# Patient Record
Sex: Female | Born: 1985 | Race: White | Hispanic: No | Marital: Single | Smoking: Never smoker
Health system: Southern US, Community
[De-identification: ages and names within clinical notes are randomized; demographics above are authoritative.]

## PROBLEM LIST (undated history)

## (undated) DIAGNOSIS — F419 Anxiety disorder, unspecified: Secondary | ICD-10-CM

## (undated) DIAGNOSIS — J45909 Unspecified asthma, uncomplicated: Secondary | ICD-10-CM

## (undated) DIAGNOSIS — N390 Urinary tract infection, site not specified: Secondary | ICD-10-CM

## (undated) DIAGNOSIS — K589 Irritable bowel syndrome without diarrhea: Secondary | ICD-10-CM

## (undated) DIAGNOSIS — F32A Depression, unspecified: Secondary | ICD-10-CM

## (undated) DIAGNOSIS — F329 Major depressive disorder, single episode, unspecified: Secondary | ICD-10-CM

## (undated) HISTORY — DX: Anxiety disorder, unspecified: F41.9

## (undated) HISTORY — DX: Major depressive disorder, single episode, unspecified: F32.9

## (undated) HISTORY — DX: Depression, unspecified: F32.A

## (undated) HISTORY — DX: Unspecified asthma, uncomplicated: J45.909

## (undated) HISTORY — DX: Irritable bowel syndrome, unspecified: K58.9

## (undated) HISTORY — DX: Urinary tract infection, site not specified: N39.0

## (undated) HISTORY — PX: TYMPANOSTOMY TUBE PLACEMENT: SHX32

---
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
## 2007-05-07 ENCOUNTER — Emergency Department (HOSPITAL_COMMUNITY): Admission: EM | Admit: 2007-05-07 | Discharge: 2007-05-07 | Payer: Self-pay | Admitting: Emergency Medicine

## 2013-06-23 ENCOUNTER — Ambulatory Visit (INDEPENDENT_AMBULATORY_CARE_PROVIDER_SITE_OTHER): Payer: BC Managed Care – PPO | Admitting: Family Medicine

## 2013-06-23 ENCOUNTER — Encounter: Payer: Self-pay | Admitting: Family Medicine

## 2013-06-23 VITALS — BP 110/70 | Temp 98.3°F | Ht 69.5 in | Wt 126.0 lb

## 2013-06-23 DIAGNOSIS — Z7189 Other specified counseling: Secondary | ICD-10-CM

## 2013-06-23 DIAGNOSIS — Z7689 Persons encountering health services in other specified circumstances: Secondary | ICD-10-CM

## 2013-06-23 DIAGNOSIS — R300 Dysuria: Secondary | ICD-10-CM

## 2013-06-23 DIAGNOSIS — Z23 Encounter for immunization: Secondary | ICD-10-CM

## 2013-06-23 LAB — POCT URINALYSIS DIPSTICK
Glucose, UA: NEGATIVE
Ketones, UA: NEGATIVE
Protein, UA: NEGATIVE
Urobilinogen, UA: 0.2

## 2013-06-23 MED ORDER — CIPROFLOXACIN HCL 500 MG PO TABS: 500.0000 mg | ORAL_TABLET | Freq: Two times a day (BID) | ORAL | Status: DC

## 2013-06-23 NOTE — Progress Notes (Signed)
Chief Complaint  Patient presents with  . Establish Care    HPI:  [place] is here to establish care. Recently moved from [place]. Last PCP and physical: last physical with pap last summer and normal  Has the following chronic problems and concerns today:  1) Dysuria: -started yesterday -symptoms: burning with urination, pinker urine, frequency -denies: fevers, NVD, flank pain -hx UTI in the last year -FDLMP: last week  There are no active problems to display for this patient.  Health Maintenance: -has had diabetes and lipid screening per her report in last year -pap in 2013 -tdap in 2009 per her report -refused flu vaccine -refused hpv  ROS: See pertinent positives and negatives per HPI.  Past Medical History  Diagnosis Date  . UTI (lower urinary tract infection)   . Depression     on antidepressants in the past, never SI or hospitalization    Family History  Problem Relation Age of Onset  . Adopted: Yes  . Kidney disease Maternal Aunt     History   Social History  . Marital Status: Single    Spouse Name: N/A    Number of Children: N/A  . Years of Education: N/A   Social History Main Topics  . Smoking status: Never Smoker   . Smokeless tobacco: None  . Alcohol Use: None     Comment: rare alchol; 2 drinks a few times per week  . Drug Use: None  . Sexual Activity: Yes    Birth Control/ Protection: Condom     Comment: one partner   Other Topics Concern  . None   Social History Narrative   Work or School: works at [employer] Situation: lives with roommate      Spiritual Beliefs: Christian      Lifestyle: regular CV exercise 3-4 times per week; good diet             Current outpatient prescriptions:ciprofloxacin (CIPRO) 500 MG tablet, Take 1 tablet (500 mg total) by mouth 2 (two) times daily., Disp: 6 tablet, Rfl: 0  EXAM:  Filed Vitals:   06/23/13 1425  BP: 110/70  Temp: 98.3 F (36.8 C)    Body mass  index is 18.35 kg/(m^2).  GENERAL: vitals reviewed and listed above, alert, oriented, appears well hydrated and in no acute distress  HEENT: atraumatic, conjunttiva clear, no obvious abnormalities on inspection of external nose and ears  NECK: no obvious masses on inspection  LUNGS: clear to auscultation bilaterally, no wheezes, rales or rhonchi, good air movement  CV: HRRR, no peripheral edema  ABD: soft, NTTP, no CVA TTP  MS: moves all extremities without noticeable abnormality  PSYCH: pleasant and cooperative, no obvious depression or anxiety  ASSESSMENT AND PLAN:  Discussed the following assessment and plan:  Need for prophylactic vaccination and inoculation against influenza  Dysuria -urine dip with pyuria and a little blood - discussed options and she wants to do empiric treatment, risks discussed -culture pending -just finished period so symptoms and blood on udip could be from that -return precautions  Encounter to establish care  -We reviewed the PMH, PSH, FH, SH, Meds and Allergies. -We provided refills for any medications we will prescribe as needed. -We addressed current concerns per orders and patient instructions. -We have asked for records for pertinent exams, studies, vaccines and notes from previous providers. -We have advised patient to follow up per instructions below.   -Patient advised to return or notify  a doctor immediately if symptoms worsen or persist or new concerns arise.  Patient Instructions  -We have ordered labs or studies at this visit. It can take up to 1-2 weeks for results and processing. We will contact you with instructions IF your results are abnormal. Normal results will be released to your [facility]. If you have not heard from [place] or can not find your results in [facility] in 2 weeks please contact our office.  -PLEASE SIGN UP FOR MYCHART TODAY   We recommend the following healthy lifestyle measures: - eat a healthy diet consisting of  lots of vegetables, fruits, beans, nuts, seeds, healthy meats such as white chicken and fish and whole grains.  - avoid fried foods, fast food, processed foods, sodas, red meet and other fattening foods.  - get a least 150 minutes of aerobic exercise per week.   Follow up in: 1 year or as needed      [place], [place].

## 2013-06-23 NOTE — Addendum Note (Signed)
Addended by: Kern Reap B on: 06/23/2013 03:04 PM   Modules accepted: Orders

## 2013-06-23 NOTE — Patient Instructions (Signed)
-  We have ordered labs or studies at this visit. It can take up to 1-2 weeks for results and processing. We will contact you with instructions IF your results are abnormal. Normal results will be released to your MYCHART. If you have not heard from us or can not find your results in MYCHART in 2 weeks please contact our office.  -PLEASE SIGN UP FOR MYCHART TODAY   We recommend the following healthy lifestyle measures: - eat a healthy diet consisting of lots of vegetables, fruits, beans, nuts, seeds, healthy meats such as white chicken and fish and whole grains.  - avoid fried foods, fast food, processed foods, sodas, red meet and other fattening foods.  - get a least 150 minutes of aerobic exercise per week.   Follow up in: 1 year or as needed  

## 2013-06-25 LAB — URINE CULTURE: Colony Count: >=100000

## 2013-06-30 NOTE — Progress Notes (Signed)
Quick Note:  Called and spoke with pt and pt is aware. ______ 

## 2013-07-14 ENCOUNTER — Ambulatory Visit (INDEPENDENT_AMBULATORY_CARE_PROVIDER_SITE_OTHER): Payer: BC Managed Care – PPO | Admitting: Family Medicine

## 2013-07-14 ENCOUNTER — Encounter: Payer: Self-pay | Admitting: Family Medicine

## 2013-07-14 VITALS — BP 98/68 | Temp 98.4°F | Wt 126.0 lb

## 2013-07-14 DIAGNOSIS — N943 Premenstrual tension syndrome: Secondary | ICD-10-CM

## 2013-07-14 DIAGNOSIS — Z0000 Encounter for general adult medical examination without abnormal findings: Secondary | ICD-10-CM

## 2013-07-14 DIAGNOSIS — L709 Acne, unspecified: Secondary | ICD-10-CM

## 2013-07-14 DIAGNOSIS — Z113 Encounter for screening for infections with a predominantly sexual mode of transmission: Secondary | ICD-10-CM

## 2013-07-14 DIAGNOSIS — L708 Other acne: Secondary | ICD-10-CM

## 2013-07-14 MED ORDER — NORGESTIM-ETH ESTRAD TRIPHASIC 0.18/0.215/0.25 MG-35 MCG PO TABS: 1.0000 | ORAL_TABLET | Freq: Every day | ORAL | Status: DC

## 2013-07-14 NOTE — Patient Instructions (Signed)
-  folic acid daily; Vit D 3 1000 IU daily, calcium 1200mg  total from food and supplement  -We have ordered labs or studies at this visit. It can take up to 1-2 weeks for results and processing. We will contact you with instructions IF your results are abnormal. Normal results will be released to your [facility]. If you have not heard from [place] or can not find your results in [facility] in 2 weeks please contact our office.  -As we discussed, we have prescribed a new medication (birth control pill) for you at this appointment. We discussed the common and serious potential adverse effects of this medication and you can review these and more with the pharmacist when you pick up your medication.  Please follow the instructions for use carefully and notify us immediately if you have any problems taking this medication.  -for acne: Benzoyl peroxide wash daily Cetaphil acne line of washes and lotion if needed  -follow up in 2 months

## 2013-07-14 NOTE — Progress Notes (Addendum)
Chief Complaint  Patient presents with  . Annual Exam    HPI:  Here for CPE:  -Concerns today: none  -Diet: variety of foods, balance and well rounded  -Taking folic acid, vit D, calcium: no  -Exercise: CV exercise 3-4 ties per week  -Diabetes and Dyslipidemia Screening: done last year per her report  -Hx of HTN: no  -Vaccines: refused flu and hpv last visit  -pap history: thinks had this last year and normal   -FDLMP: 1 week ago, has some PMS about 1 week before periods and some ance on chin for this - she wants to start OCPs to help her ance and PMS  -sexual activity: yes, female partner, no new partners  -wants STI testing: wants HIV testing, RPR, Hep B screening  -FH breast, colon or ovarian ca: see FH  -Alcohol, Tobacco, drug use: see social history  Review of Systems - negative except where scheduled  Past Medical History  Diagnosis Date  . UTI (lower urinary tract infection)   . Depression     on antidepressants in the past, never SI or hospitalization  . UTI (lower urinary tract infection)     Past Surgical History  Procedure Laterality Date  . Tympanostomy tube placement    . Eye surgery      Family History  Problem Relation Age of Onset  . Adopted: Yes  . Kidney disease Maternal Aunt     History   Social History  . Marital Status: Single    Spouse Name: N/A    Number of Children: N/A  . Years of Education: N/A   Social History Main Topics  . Smoking status: Never Smoker   . Smokeless tobacco: None  . Alcohol Use: None     Comment: rare alchol; 2 drinks a few times per week  . Drug Use: None  . Sexual Activity: Yes    Birth Control/ Protection: Condom     Comment: one partner   Other Topics Concern  . None   Social History Narrative   Work or School: works at [employer] Situation: lives with roommate      Spiritual Beliefs: Christian      Lifestyle: regular CV exercise 3-4 times per week; good diet             Current outpatient prescriptions:Norgestimate-Ethinyl Estradiol Triphasic (ORTHO TRI-CYCLEN, 28,) 0.18/0.215/0.25 MG-35 MCG tablet, Take 1 tablet by mouth daily., Disp: 1 Package, Rfl: 11  EXAM:  Filed Vitals:   07/14/13 0824  BP: 98/68  Temp: 98.4 F (36.9 C)    GENERAL: vitals reviewed and listed below, alert, oriented, appears well hydrated and in no acute distress  HEENT: head atraumatic,normal appearance of eyes, ears, nose and mouth. moist mucus membranes.  NECK: supple, no masses or lymphadenopathy  LUNGS: clear to auscultation bilaterally, no rales, rhonchi or wheeze  CV: HRRR, no peripheral edema or cyanosis, normal pedal pulses  BREAST: deferred  GU: refused  RECTAL: refused  SKIN: no rash or abnormal lesions  MS: normal gait, moves all extremities normally  NEURO:  normal muscle strength and sensation to light touch on extremities  PSYCH: normal affect, pleasant and cooperative  ASSESSMENT AND PLAN:  Discussed the following assessment and plan:  Visit for preventive health examination  Acne - Plan: Hepatitis B Core AB, Total, POCT urine pregnancy, Norgestimate-Ethinyl Estradiol Triphasic (ORTHO TRI-CYCLEN, 28,) 0.18/0.215/0.25 MG-35 MCG tablet  PMS (premenstrual syndrome) - Plan: Hepatitis B Core AB,  Total, POCT urine pregnancy, Norgestimate-Ethinyl Estradiol Triphasic (ORTHO TRI-CYCLEN, 28,) 0.18/0.215/0.25 MG-35 MCG tablet  Venereal disease screening - Plan: HIV Antibody, RPR, Hep B Surface Antigen, Hepatitis B Core AB, Total   -Discussed and advised all [place] preventive services health task force level A and B recommendations for age, sex and risks.  -refused pap and GC/Chlam/Trich testing  -discussed acne and PMS and advised per orders and instructions - risks discussed  -Advised at least 150 minutes of exercise per week and a healthy diet low in saturated fats and sweets and consisting of fresh fruits and vegetables, lean meats such as fish  and white chicken and whole grains.  -labs, studies and vaccines per orders this encounter  Orders Placed This Encounter  Procedures  . HIV Antibody  . RPR  . Hep B Surface Antigen  . Hepatitis B Core AB, Total  . POCT urine pregnancy    Patient Instructions  -folic acid daily; Vit D 3 1000 IU daily, calcium 1200mg  total from food and supplement  -We have ordered labs or studies at this visit. It can take up to 1-2 weeks for results and processing. We will contact you with instructions IF your results are abnormal. Normal results will be released to your [facility]. If you have not heard from [place] or can not find your results in [facility] in 2 weeks please contact our office.  -As we discussed, we have prescribed a new medication (birth control pill) for you at this appointment. We discussed the common and serious potential adverse effects of this medication and you can review these and more with the pharmacist when you pick up your medication.  Please follow the instructions for use carefully and notify us immediately if you have any problems taking this medication.  -for acne: Benzoyl peroxide wash daily Cetaphil acne line of washes and lotion if needed  -follow up in 2 months       Patient advised to return to clinic immediately if symptoms worsen or persist or new concerns.    No Follow-up on file.  Kriste Basque R.

## 2013-07-15 LAB — RPR

## 2013-07-15 LAB — HIV ANTIBODY (ROUTINE TESTING W REFLEX): HIV: NONREACTIVE

## 2013-07-15 NOTE — Progress Notes (Signed)
Quick Note:  Left a message for pt that labs are normal. ______

## 2014-03-28 ENCOUNTER — Ambulatory Visit (INDEPENDENT_AMBULATORY_CARE_PROVIDER_SITE_OTHER): Payer: BC Managed Care – PPO | Admitting: Family Medicine

## 2014-03-28 ENCOUNTER — Encounter: Payer: Self-pay | Admitting: Family Medicine

## 2014-03-28 ENCOUNTER — Telehealth: Payer: Self-pay | Admitting: Family Medicine

## 2014-03-28 VITALS — BP 100/64 | HR 67 | Temp 98.9°F | Ht 69.5 in | Wt 129.0 lb

## 2014-03-28 DIAGNOSIS — R350 Frequency of micturition: Secondary | ICD-10-CM

## 2014-03-28 LAB — POCT URINALYSIS DIPSTICK
BILIRUBIN UA: NEGATIVE
Ketones, UA: NEGATIVE
Nitrite, UA: POSITIVE
Protein, UA: NEGATIVE
UROBILINOGEN UA: 1
pH, UA: 5.5

## 2014-03-28 MED ORDER — CIPROFLOXACIN HCL 500 MG PO TABS: 500.0000 mg | ORAL_TABLET | Freq: Two times a day (BID) | ORAL | Status: DC

## 2014-03-28 MED ORDER — CIPROFLOXACIN HCL 250 MG PO TABS: 250.0000 mg | ORAL_TABLET | Freq: Two times a day (BID) | ORAL | Status: DC

## 2014-03-28 NOTE — Progress Notes (Signed)
No chief complaint on file.   HPI:  Acute visit for:  1)Dysuria: -started this morning -urinary urgency and frequency, dysuria -denies: fevers, vomiting, abd or flank pain, a little back pain, blood in urine, vaginal symptoms -FDLMP: 1 week ago  ROS: See pertinent positives and negatives per HPI.  Past Medical History  Diagnosis Date  . UTI (lower urinary tract infection)   . Depression     on antidepressants in the past, never SI or hospitalization  . UTI (lower urinary tract infection)     Past Surgical History  Procedure Laterality Date  . Tympanostomy tube placement    . Eye surgery      Family History  Problem Relation Age of Onset  . Adopted: Yes  . Kidney disease Maternal Aunt     History   Social History  . Marital Status: Single    Spouse Name: N/A    Number of Children: N/A  . Years of Education: N/A   Social History Main Topics  . Smoking status: Never Smoker   . Smokeless tobacco: None  . Alcohol Use: None     Comment: rare alchol; 2 drinks a few times per week  . Drug Use: None  . Sexual Activity: Yes    Birth Control/ Protection: Condom     Comment: one partner   Other Topics Concern  . None   Social History Narrative   Work or School: works at [employer]      Home Situation: lives with roommate      Spiritual Beliefs: Christian      Lifestyle: regular CV exercise 3-4 times per week; good diet             Current outpatient prescriptions:Norgestimate-Ethinyl Estradiol Triphasic (ORTHO TRI-CYCLEN, 28,) 0.18/0.215/0.25 MG-35 MCG tablet, Take 1 tablet by mouth daily., Disp: 1 Package, Rfl: 11;  Phenazopyridine HCl (AZO TABS PO), Take by mouth., Disp: , Rfl: ;  ciprofloxacin (CIPRO) 500 MG tablet, Take 1 tablet (500 mg total) by mouth 2 (two) times daily., Disp: 6 tablet, Rfl: 0  EXAM:  Filed Vitals:   03/28/14 1123  BP: 100/64  Pulse: 67  Temp: 98.9 F (37.2 C)    Body mass index is 18.78 kg/(m^2).  GENERAL: vitals  reviewed and listed above, alert, oriented, appears well hydrated and in no acute distress  HEENT: atraumatic, conjunttiva clear, no obvious abnormalities on inspection of external nose and ears  NECK: no obvious masses on inspection  LUNGS: clear to auscultation bilaterally, no wheezes, rales or rhonchi, good air movement  CV: HRRR, no peripheral edema  ABD: soft, NTTP, no CVA TTP  MS: moves all extremities without noticeable abnormality  PSYCH: pleasant and cooperative, no obvious depression or anxiety  ASSESSMENT AND PLAN:  Discussed the following assessment and plan:  Urinary frequency - Plan: POC Urinalysis Dipstick, CULTURE, URINE COMPREHENSIVE, ciprofloxacin (CIPRO) 500 MG tablet, DISCONTINUED: ciprofloxacin (CIPRO) 250 MG tablet  -urine dip c/w  UTI - start cipro after discussion risks/benefits, culture pending -Patient advised to return or notify a doctor immediately if symptoms worsen or persist or new concerns arise.  Patient Instructions  Urinary Tract Infection Urinary tract infections (UTIs) can develop anywhere along your urinary tract. Your urinary tract is your body's drainage system for removing wastes and extra water. Your urinary tract includes two kidneys, two ureters, a bladder, and a urethra. Your kidneys are a pair of bean-shaped organs. Each kidney is about the size of your fist. They are located below  your ribs, one on each side of your spine. CAUSES Infections are caused by microbes, which are microscopic organisms, including fungi, viruses, and bacteria. These organisms are so small that they can only be seen through a microscope. Bacteria are the microbes that most commonly cause UTIs. SYMPTOMS  Symptoms of UTIs may vary by age and gender of the patient and by the location of the infection. Symptoms in young women typically include a frequent and intense urge to urinate and a painful, burning feeling in the bladder or urethra during urination. Older women  and men are more likely to be tired, shaky, and weak and have muscle aches and abdominal pain. A fever may mean the infection is in your kidneys. Other symptoms of a kidney infection include pain in your back or sides below the ribs, nausea, and vomiting. DIAGNOSIS To diagnose a UTI, your caregiver will ask you about your symptoms. Your caregiver also will ask to provide a urine sample. The urine sample will be tested for bacteria and white blood cells. White blood cells are made by your body to help fight infection. TREATMENT  Typically, UTIs can be treated with medication. Because most UTIs are caused by a bacterial infection, they usually can be treated with the use of antibiotics. The choice of antibiotic and length of treatment depend on your symptoms and the type of bacteria causing your infection. HOME CARE INSTRUCTIONS  If you were prescribed antibiotics, take them exactly as your caregiver instructs you. Finish the medication even if you feel better after you have only taken some of the medication.  Drink enough water and fluids to keep your urine clear or pale yellow.  Avoid caffeine, tea, and carbonated beverages. They tend to irritate your bladder.  Empty your bladder often. Avoid holding urine for long periods of time.  Empty your bladder before and after sexual intercourse.  After a bowel movement, women should cleanse from front to back. Use each tissue only once. SEEK MEDICAL CARE IF:   You have back pain.  You develop a fever.  Your symptoms do not begin to resolve within 3 days. SEEK IMMEDIATE MEDICAL CARE IF:   You have severe back pain or lower abdominal pain.  You develop chills.  You have nausea or vomiting.  You have continued burning or discomfort with urination. MAKE SURE YOU:   Understand these instructions.  Will watch your condition.  Will get help right away if you are not doing well or get worse. Document Released: 07/23/2005 Document Revised:  04/13/2012 Document Reviewed: 11/21/2011 [facility] Patient Information 2014 [place], [place].      Terressa Koyanagi

## 2014-03-28 NOTE — Telephone Encounter (Signed)
Cindy from [facility] calling to confirm correct rx for ciprofloxacin (CIPRO).  She states they received two scripts one for 250mg  and one for 500mg .  Which one should they fill.  Please call back.

## 2014-03-28 NOTE — Patient Instructions (Signed)
Urinary Tract Infection  Urinary tract infections (UTIs) can develop anywhere along your urinary tract. Your urinary tract is your body's drainage system for removing wastes and extra water. Your urinary tract includes two kidneys, two ureters, a bladder, and a urethra. Your kidneys are a pair of bean-shaped organs. Each kidney is about the size of your fist. They are located below your ribs, one on each side of your spine.  CAUSES  Infections are caused by microbes, which are microscopic organisms, including fungi, viruses, and bacteria. These organisms are so small that they can only be seen through a microscope. Bacteria are the microbes that most commonly cause UTIs.  SYMPTOMS   Symptoms of UTIs may vary by age and gender of the patient and by the location of the infection. Symptoms in young women typically include a frequent and intense urge to urinate and a painful, burning feeling in the bladder or urethra during urination. Older women and men are more likely to be tired, shaky, and weak and have muscle aches and abdominal pain. A fever may mean the infection is in your kidneys. Other symptoms of a kidney infection include pain in your back or sides below the ribs, nausea, and vomiting.  DIAGNOSIS  To diagnose a UTI, your caregiver will ask you about your symptoms. Your caregiver also will ask to provide a urine sample. The urine sample will be tested for bacteria and white blood cells. White blood cells are made by your body to help fight infection.  TREATMENT   Typically, UTIs can be treated with medication. Because most UTIs are caused by a bacterial infection, they usually can be treated with the use of antibiotics. The choice of antibiotic and length of treatment depend on your symptoms and the type of bacteria causing your infection.  HOME CARE INSTRUCTIONS   If you were prescribed antibiotics, take them exactly as your caregiver instructs you. Finish the medication even if you feel better after you  have only taken some of the medication.   Drink enough water and fluids to keep your urine clear or pale yellow.   Avoid caffeine, tea, and carbonated beverages. They tend to irritate your bladder.   Empty your bladder often. Avoid holding urine for long periods of time.   Empty your bladder before and after sexual intercourse.   After a bowel movement, women should cleanse from front to back. Use each tissue only once.  SEEK MEDICAL CARE IF:    You have back pain.   You develop a fever.   Your symptoms do not begin to resolve within 3 days.  SEEK IMMEDIATE MEDICAL CARE IF:    You have severe back pain or lower abdominal pain.   You develop chills.   You have nausea or vomiting.   You have continued burning or discomfort with urination.  MAKE SURE YOU:    Understand these instructions.   Will watch your condition.   Will get help right away if you are not doing well or get worse.  Document Released: 07/23/2005 Document Revised: 04/13/2012 Document Reviewed: 11/21/2011  [employer] Patient Information 2014 [employer].

## 2014-03-28 NOTE — Progress Notes (Signed)
Pre visit review using our clinic review tool, if applicable. No additional management support is needed unless otherwise documented below in the visit note. 

## 2014-03-28 NOTE — Telephone Encounter (Signed)
Per Dr Selena Batten the pt should have Cipro 500mg  and I called the pharmacy and Arline Asp was informed of this.

## 2014-03-31 LAB — CULTURE, URINE COMPREHENSIVE

## 2014-06-14 ENCOUNTER — Encounter: Payer: Self-pay | Admitting: Family Medicine

## 2014-06-14 ENCOUNTER — Ambulatory Visit (INDEPENDENT_AMBULATORY_CARE_PROVIDER_SITE_OTHER): Payer: BC Managed Care – PPO | Admitting: Family Medicine

## 2014-06-14 VITALS — BP 98/64 | HR 72 | Temp 99.4°F | Ht 69.5 in | Wt 124.5 lb

## 2014-06-14 DIAGNOSIS — Z3009 Encounter for other general counseling and advice on contraception: Secondary | ICD-10-CM

## 2014-06-14 DIAGNOSIS — F32A Depression, unspecified: Secondary | ICD-10-CM

## 2014-06-14 DIAGNOSIS — F3289 Other specified depressive episodes: Secondary | ICD-10-CM

## 2014-06-14 DIAGNOSIS — F329 Major depressive disorder, single episode, unspecified: Secondary | ICD-10-CM

## 2014-06-14 MED ORDER — FLUVOXAMINE MALEATE 50 MG PO TABS: 50.0000 mg | ORAL_TABLET | Freq: Every day | ORAL | Status: DC

## 2014-06-14 NOTE — Progress Notes (Signed)
Pre visit review using our clinic review tool, if applicable. No additional management support is needed unless otherwise documented below in the visit note. 

## 2014-06-14 NOTE — Patient Instructions (Signed)
-  As we discussed, we have prescribed a new medication (luvox) for you at this appointment. We discussed the common and serious potential adverse effects of this medication and you can review these and more with the pharmacist when you pick up your medication.  Please follow the instructions for use carefully and notify us immediately if you have any problems taking this medication. -do not stop this medication suddenly - taper  -set up counseling  -follow up in 2-4 weeks

## 2014-06-14 NOTE — Progress Notes (Signed)
No chief complaint on file.   HPI:  1)Contraceptive Management: -on orthotricyclin  -stable   2)Depression: -reports intermittent depression since college -symptoms: sadness, irritability, depressed mood, worse prior to periods, lack of energy -she is exercising and eating well -hx of pain in multiple sites but reports negative workup with rheumatologist -took fluvoxamine in the past and this worked well -lost mother and father during highschool   ROS: See pertinent positives and negatives per HPI.  Past Medical History  Diagnosis Date  . UTI (lower urinary tract infection)   . Depression     on antidepressants in the past, never SI or hospitalization  . UTI (lower urinary tract infection)     Past Surgical History  Procedure Laterality Date  . Tympanostomy tube placement    . Eye surgery      Family History  Problem Relation Age of Onset  . Adopted: Yes  . Kidney disease Maternal Aunt     History   Social History  . Marital Status: Single    Spouse Name: N/A    Number of Children: N/A  . Years of Education: N/A   Social History Main Topics  . Smoking status: Never Smoker   . Smokeless tobacco: None  . Alcohol Use: None     Comment: rare alchol; 2 drinks a few times per week  . Drug Use: None  . Sexual Activity: Yes    Birth Control/ Protection: Condom     Comment: one partner   Other Topics Concern  . None   Social History Narrative   Work or School: works at [employer] Situation: lives with roommate      Spiritual Beliefs: Christian      Lifestyle: regular CV exercise 3-4 times per week; good diet             Current outpatient prescriptions:Norgestimate-Ethinyl Estradiol Triphasic (ORTHO TRI-CYCLEN, 28,) 0.18/0.215/0.25 MG-35 MCG tablet, Take 1 tablet by mouth daily., Disp: 1 Package, Rfl: 11;  fluvoxaMINE (LUVOX) 50 MG tablet, Take 1 tablet (50 mg total) by mouth at bedtime., Disp: 30 tablet, Rfl: 1  EXAM:  Filed  Vitals:   06/14/14 1021  BP: 98/64  Pulse: 72  Temp: 99.4 F (37.4 C)    Body mass index is 18.13 kg/(m^2).  GENERAL: vitals reviewed and listed above, alert, oriented, appears well hydrated and in no acute distress  HEENT: atraumatic, conjunttiva clear, no obvious abnormalities on inspection of external nose and ears  NECK: no obvious masses on inspection  LUNGS: clear to auscultation bilaterally, no wheezes, rales or rhonchi, good air movement  CV: HRRR, no peripheral edema  MS: moves all extremities without noticeable abnormality  PSYCH: pleasant and cooperative, no obvious depression or anxiety  ASSESSMENT AND PLAN:  Discussed the following assessment and plan:  Depression - Plan: fluvoxaMINE (LUVOX) 50 MG tablet  Encounter for other general counseling or advice on contraception  -discussed options and risks at lengths - she opted for CBT and will schedule and starting luvox - follow in 2- 4 weeks -discussed doing some basic lab work - but she has had labs before and ok per her report -Patient advised to return or notify a doctor immediately if symptoms worsen or persist or new concerns arise.  Patient Instructions  -As we discussed, we have prescribed a new medication (luvox) for you at this appointment. We discussed the common and serious potential adverse effects of this medication and you can review these  and more with the pharmacist when you pick up your medication.  Please follow the instructions for use carefully and notify us immediately if you have any problems taking this medication. -do not stop this medication suddenly - taper  -set up counseling  -follow up in 2-4 weeks       KIM, HANNAH R.

## 2014-07-06 ENCOUNTER — Ambulatory Visit (INDEPENDENT_AMBULATORY_CARE_PROVIDER_SITE_OTHER): Payer: BC Managed Care – PPO | Admitting: Family Medicine

## 2014-07-06 ENCOUNTER — Encounter: Payer: Self-pay | Admitting: Family Medicine

## 2014-07-06 VITALS — BP 100/68 | HR 82 | Temp 98.6°F | Ht 69.5 in | Wt 126.5 lb

## 2014-07-06 DIAGNOSIS — N946 Dysmenorrhea, unspecified: Secondary | ICD-10-CM

## 2014-07-06 DIAGNOSIS — R5383 Other fatigue: Secondary | ICD-10-CM

## 2014-07-06 DIAGNOSIS — F3289 Other specified depressive episodes: Secondary | ICD-10-CM

## 2014-07-06 DIAGNOSIS — F32A Depression, unspecified: Secondary | ICD-10-CM

## 2014-07-06 DIAGNOSIS — L680 Hirsutism: Secondary | ICD-10-CM

## 2014-07-06 DIAGNOSIS — F329 Major depressive disorder, single episode, unspecified: Secondary | ICD-10-CM

## 2014-07-06 DIAGNOSIS — R5381 Other malaise: Secondary | ICD-10-CM

## 2014-07-06 MED ORDER — FLUVOXAMINE MALEATE 50 MG PO TABS: 100.0000 mg | ORAL_TABLET | Freq: Every day | ORAL | Status: DC

## 2014-07-06 NOTE — Patient Instructions (Signed)
-  increase fluvoxamine to 50 mg in the morning and  in the evening for 1 week  -then increase to  in the morning and 50 mg in the evening  -then  twice daily

## 2014-07-06 NOTE — Progress Notes (Signed)
No chief complaint on file.   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 Reports: doing a little better, improved after period, tolerating the medication well, has not set up counseling  Depression Symptoms: Sleep disorder: yes Interest deficit/anhedonia: improved a little Guilt (worthlessness, hopelessness, regret): no Energy deficit: still tired in the afternoon Concentration deficit: yes, not improved Appetite disorder: cravings, not improved Psychomotor retardation or agitation: sluggish Suicidality: no  Acne/Dysmenorrhea/Hair on neck: -reports has had these issues for greater then 10 years with hormones and thyroid check in the past and normal and nobody helps her with this -wants to see an endocrinologist ROS: See pertinent positives and negatives per HPI.  Past Medical History  Diagnosis Date  . UTI (lower urinary tract infection)   . Depression     on antidepressants in the past, never SI or hospitalization  . UTI (lower urinary tract infection)     Past Surgical History  Procedure Laterality Date  . Tympanostomy tube placement    . Eye surgery      Family History  Problem Relation Age of Onset  . Adopted: Yes  . Kidney disease Maternal Aunt     History   Social History  . Marital Status: Single    Spouse Name: N/A    Number of Children: N/A  . Years of Education: N/A   Social History Main Topics  . Smoking status: Never Smoker   . Smokeless tobacco: None  . Alcohol Use: None     Comment: rare alchol; 2 drinks a few times per week  . Drug Use: None  . Sexual Activity: Yes    Birth Control/ Protection: Condom     Comment: one partner   Other Topics Concern  . None   Social History Narrative   Work or School: works at [employer] Situation: lives with roommate      Spiritual Beliefs: Christian      Lifestyle: regular CV exercise 3-4 times per week; good diet              Current outpatient prescriptions:fluvoxaMINE (LUVOX) 50 MG tablet, Take 2 tablets (100 mg total) by mouth at bedtime., Disp: 120 tablet, Rfl: 3;  Norgestimate-Ethinyl Estradiol Triphasic (ORTHO TRI-CYCLEN, 28,) 0.18/0.215/0.25 MG-35 MCG tablet, Take 1 tablet by mouth daily., Disp: 1 Package, Rfl: 11  EXAM:  Filed Vitals:   07/06/14 1520  BP: 100/68  Pulse: 82  Temp: 98.6 F (37 C)    Body mass index is 18.42 kg/(m^2).  GENERAL: vitals reviewed and listed above, alert, oriented, appears well hydrated and in no acute distress  HEENT: atraumatic, conjunttiva clear, no obvious abnormalities on inspection of external nose and ears  NECK: no obvious masses on inspection  LUNGS: clear to auscultation bilaterally, no wheezes, rales or rhonchi, good air movement  CV: HRRR, no peripheral edema  MS: moves all extremities without noticeable abnormality  PSYCH: pleasant and cooperative, no obvious depression or anxiety  ASSESSMENT AND PLAN:  Discussed the following assessment and plan:  Depression - Plan: fluvoxaMINE (LUVOX) 50 MG tablet, CANCELED: Hemoglobin A1c, CANCELED: TSH, CANCELED: CBC with Differential, CANCELED: T4, Free, CANCELED: Testosterone, Free, Total  Other malaise and fatigue - Plan: Ambulatory referral to Endocrinology, CANCELED: Hemoglobin A1c, CANCELED: TSH, CANCELED: CBC with Differential, CANCELED: T4, Free, CANCELED: Testosterone, Free, Total  Hirsutism - Plan: Ambulatory referral to Endocrinology  Dysmenorrhea - Plan: Ambulatory referral to  Endocrinology  -discussed PCOS, other diagnosis, dx, treatment of symptoms - and advised some lab work - she really wants to see and endocrinologist for this so referral placed and does not want to get labs today as reports she may pass out and wants to wait to see endocrinologist -for depression: advised counseling and increasing luvox per titration instructions -follow up in 4-6 weeks -Patient advised to  return or notify a doctor immediately if symptoms worsen or persist or new concerns arise.  Patient Instructions  -increase fluvoxamine to 50 mg in the morning and  in the evening for 1 week  -then increase to  in the morning and 50 mg in the evening  -then  twice daily     Amiel Sharrow R.

## 2014-07-06 NOTE — Progress Notes (Signed)
Pre visit review using our clinic review tool, if applicable. No additional management support is needed unless otherwise documented below in the visit note. 

## 2014-07-22 ENCOUNTER — Other Ambulatory Visit: Payer: Self-pay | Admitting: Family Medicine

## 2014-07-25 ENCOUNTER — Other Ambulatory Visit: Payer: Self-pay | Admitting: Internal Medicine

## 2014-07-25 ENCOUNTER — Ambulatory Visit (INDEPENDENT_AMBULATORY_CARE_PROVIDER_SITE_OTHER): Payer: BC Managed Care – PPO | Admitting: Internal Medicine

## 2014-07-25 ENCOUNTER — Encounter: Payer: Self-pay | Admitting: Internal Medicine

## 2014-07-25 VITALS — BP 102/60 | HR 68 | Temp 98.0°F | Resp 12 | Ht 69.0 in | Wt 129.0 lb

## 2014-07-25 DIAGNOSIS — L680 Hirsutism: Secondary | ICD-10-CM

## 2014-07-25 MED ORDER — LEVONORGEST-ETH ESTRAD 91-DAY 0.15-0.03 &0.01 MG PO TABS: 1.0000 | ORAL_TABLET | Freq: Every day | ORAL | Status: DC

## 2014-07-25 NOTE — Progress Notes (Addendum)
Patient ID: Destiny Wade, female   DOB: 1986/10/24, 28 y.o.   MRN: [id number]  HPI: Destiny Razoriffany Lunney is a 28 y.o. female, referred by Dr Selena BattenKim, in consultation for hirsutism (? PCOS).  Hirsutism: - legs > arms > chin > upper lip > chest - started even as a child - shaves daily - allergic to depilatory creams  - tried waxing: eyebrows, (electric tweezer)  Acne: - cystic acne - face > arms > thighs > buttocks - exacerbated recently, after age 89 y/o - not severe as a teenager - not seeing dermatology - she saw them before >> advised to try Accutane (did not try) - used to use benzoyl peroxide  Weight gain: - no - weight has been stable - no steroid use - no weight loss meds - exercises 3-5x a week: running, lifting weights  Fertility/Menstrual cycles: - had monthly menses, not quite every 4 weeks, heavy and painful - no h/o ovarian cysts - children: 0 - miscarriages:0 - contraception: triphasic OCPs  Treatments tried: - did not try Metformin - did not try Spironolactone - did not try Vaniqua - + on OCPs - started Tri-Previfem 1 year ago >> no change in acne and hirsutism - these are worse 1 week before periods >> now bleeds monthly  Other meds: - SSRIs: no, but on Luvox  - Last thyroid tests - 1 year ago: TSH normal  She was adopted. No FH of infertility, though. FH of DM in mother. She has a h/o fainting if she does not eat in time - both in mother and sister. Saw rheumatology and genetics >> has hyperlaxity of joints. Has canker sores in mouth.   ROS: Constitutional: no weight gain, + fatigue, no subjective hyperthermia/hypothermia, + poor sleep Eyes: no blurry vision, no xerophthalmia ENT: no sore throat, no nodules palpated in throat, no dysphagia/odynophagia, no hoarseness Cardiovascular: no CP/SOB/palpitations/leg swelling Respiratory: no cough/SOB Gastrointestinal: + N/no V/+ D/+ C Musculoskeletal: no muscle/+ joint aches Skin: + acne, + hair on  face Neurological: no tremors/numbness/tingling/dizziness, + easy bruising, + hair loss Psychiatric: + depression/+ anxiety  Past Medical History  Diagnosis Date  . UTI (lower urinary tract infection)   . Depression     on antidepressants in the past, never SI or hospitalization  . UTI (lower urinary tract infection)    Past Surgical History  Procedure Laterality Date  . Tympanostomy tube placement    . Eye surgery     History   Social History  . Marital Status: Single    Spouse Name: N/A    Number of Children: 0   Occupational History  . teacher   Social History Main Topics  . Smoking status: Never Smoker   . Smokeless tobacco: No  . Alcohol Use: No     Comment: beer, once a week  . Drug Use: No  . Sexual Activity: Yes    Birth Control/ Protection: Condom     Comment: one partner   Social History Narrative   Work or School: works at [employer]      Home Situation: lives with roommate      Spiritual Beliefs: Christian      Lifestyle: regular CV exercise 3-4 times per week; good diet   Current Outpatient Prescriptions on File Prior to Visit  Medication Sig Dispense Refill  . TRI-PREVIFEM 0.18/0.215/0.25 MG-35 MCG tablet TAKE 1 TABLET BY MOUTH DAILY  28 tablet  0   No current facility-administered medications on file prior to visit.  Allergies  Allergen Reactions  . Ceclor [Cefaclor]   . Penicillins     Unsure of reaction because was a baby  . Sulfa Antibiotics    Family History  Problem Relation Age of Onset  . Adopted: Yes  . Kidney disease Maternal Aunt    PE: BP 102/60  Pulse 68  Temp(Src) 98 F (36.7 C) (Oral)  Resp 12  Ht 5\' 9"  (1.753 m)  Wt 129 lb (58.514 kg)  BMI 19.04 kg/m2  SpO2 99% Wt Readings from Last 3 Encounters:  07/25/14 129 lb (58.514 kg)  07/06/14 126 lb 8 oz (57.38 kg)  06/14/14 124 lb 8 oz (56.473 kg)   Constitutional: thin, in NAD, no full supraclavicular fat pads Eyes: PERRLA, EOMI, no exophthalmos ENT: moist  mucous membranes, no thyromegaly, no cervical lymphadenopathy Cardiovascular: RRR, No MRG Respiratory: CTA B Gastrointestinal: abdomen soft, NT, ND, BS+ Musculoskeletal: no deformities, strength intact in all 4 Skin: moist, warm; + acne on face, + few dark terminal hairs on chin, + vellum on sideburns, no skin tags, no acanthosis nigricans, no purple, wide, stretch marks Neurological: no tremor with outstretched hands, DTR normal in all 4  ASSESSMENT: 1. Hirsutism - ?PCOS  PLAN: 1.  Patient has several clinical features consistent with PCOS (acne, hirsutism), so I suspect that she has this condition, although we cannot clearly verify by lab work since she is on OCPs.  - will also need to check several tests to rule out other possible endocrine problems: Orders Placed This Encounter  Procedures  . TSH  . T4, free  . T3, free  . 17-Hydroxyprogesterone  . Prolactin  . Testosterone, free, total  . HgB A1c  . Vitamin D (25 hydroxy)  . DHEA-sulfate  . Androstenedione  - I had a long discussion with the patient about the fact that the PCOS is a sum of several conditions, including:  acne  hirsutism  irregular menstrual cycles  decreased fertility  weight gain  insulin resistance (and therefore a higher risk of developing diabetes later in life) - We also discussed about the fact that the treatment is usually targeted to addressing the problem that concerns the patient the most: acne/hirsutism, weight gain, or fertility, but there is no single treatment for PCOS.  - The first-line therapy are oral contraceptives. If she is concerned with her weight, we can use metformin; if she is concerned about acne/hirsutism, we can add spironolactone; and if she is concerned about fertility, I could refer her to reproductive endocrinology for possible use of clomiphene. - she is not interested in fertility right now, she might consider this in few years - she does not have any issues with  weight gain - she is most interested in improving her acne and hirsutism - Also, she has severe symptoms of PNDD a week before her placebo pills - she is on a triphasic OCP - We discussed about the need for an oral contraceptive to suppress the testosterone levels and improved acne and hirsutism. She did not see any improvement in these after adding the triphasic OCP and also this is not ideal for PMDD. Therefore, I suggested Seasonique which will allow a period of a week withdrawal bleed every 3 months. Even during this week, Seasonique will provide a low dose of estrogen. I believe that this would improve her mood before her withdrawal bleeds, and will also have with a more consistent hormonal pattern, thus helping her hirsutism. - We also discussed about possibly adding spironolactone  in 6 months after adding Seasonique, if there is no satisfactory improvement in her acne and excess hair. I discussed about possible side effect of spironolactone to include lower blood pressure and higher potassium - Return in about 6 months (around 01/23/2015).   Component     Latest Ref Rng 07/25/2014  Testosterone     10 - 70 ng/dL 41  Sex Hormone Binding     18 - 114 nmol/L 71  Testosterone Free     0.6 - 6.8 pg/mL 4.4  Testosterone-% Free     0.4 - 2.4 % 1.1  Hemoglobin A1C     <5.7 % 5.5  Mean Plasma Glucose     <117 mg/dL 409  TSH     8.119 - 1.478 uIU/mL 1.017  Free T4     0.80 - 1.80 ng/dL 2.95  Prolactin      3.6  DHEA-SO4     18 - 391 ug/dL 621  Androstenedione      93  17-OH-Progesterone, LC/MS/MS      13  T3, Free     2.3 - 4.2 pg/mL 2.8  Vit D, 25-Hydroxy     30 - 89 ng/mL 49   Labs not indicating PCOS, but this is likely 2/2 being on OCPs. I also could not check LH/FSH for the same reason. No signs of prolactinoma, [place]-CAH, thyroid ds., DM or Prediabetes. I believe that clinically, she likely has PCOS. Will continue above plan - may add Spironolactone at next visit.

## 2014-07-25 NOTE — Patient Instructions (Addendum)
Please stop the Tri-Previfem and start Seasonique. Please stop at [facility] lab downstairs. Please come back for a follow-up appointment in 6 months. Hang in there!  Polycystic Ovarian Syndrome Polycystic ovarian syndrome (PCOS) is a common hormonal disorder among women of reproductive age. Most women with PCOS grow many small cysts on their ovaries. PCOS can cause problems with your periods and make it difficult to get pregnant. It can also cause an increased risk of miscarriage with pregnancy. If left untreated, PCOS can lead to serious health problems, such as diabetes and heart disease. CAUSES The cause of PCOS is not fully understood, but genetics may be a factor. SIGNS AND SYMPTOMS   Infrequent or no menstrual periods.   Inability to get pregnant (infertility) because of not ovulating.   Increased growth of hair on the face, chest, stomach, back, thumbs, thighs, or toes.   Acne, oily skin, or dandruff.   Pelvic pain.   Weight gain or obesity, usually carrying extra weight around the waist.   Type 2 diabetes.   High cholesterol.   High blood pressure.   Female-pattern baldness or thinning hair.   Patches of thickened and dark brown or black skin on the neck, arms, breasts, or thighs.   Tiny excess flaps of skin (skin tags) in the armpits or neck area.   Excessive snoring and having breathing stop at times while asleep (sleep apnea).   Deepening of the voice.   Gestational diabetes when pregnant.  DIAGNOSIS  There is no single test to diagnose PCOS.   Your health care provider will:   Take a medical history.   Perform a pelvic exam.   Have ultrasonography done.   Check your female and female hormone levels.   Measure glucose or sugar levels in the blood.   Do other blood tests.   If you are producing too many female hormones, your health care provider will make sure it is from PCOS. At the physical exam, your health care provider will want  to evaluate the areas of increased hair growth. Try to allow natural hair growth for a few days before the visit.   During a pelvic exam, the ovaries may be enlarged or swollen because of the increased number of small cysts. This can be seen more easily by using vaginal ultrasonography or screening to examine the ovaries and lining of the uterus (endometrium) for cysts. The uterine lining may become thicker if you have not been having a regular period.  TREATMENT  Because there is no cure for PCOS, it needs to be managed to prevent problems. Treatments are based on your symptoms. Treatment is also based on whether you want to have a baby or whether you need contraception.  Treatment may include:   Progesterone hormone to start a menstrual period.   Birth control pills to make you have regular menstrual periods.   Medicines to make you ovulate, if you want to get pregnant.   Medicines to control your insulin.   Medicine to control your blood pressure.   Medicine and diet to control your high cholesterol and triglycerides in your blood.  Medicine to reduce excessive hair growth.  Surgery, making small holes in the ovary, to decrease the amount of female hormone production. This is done through a long, lighted tube (laparoscope) placed into the pelvis through a tiny incision in the lower abdomen.  HOME CARE INSTRUCTIONS  Only take over-the-counter or prescription medicine as directed by your health care provider.  Pay attention to  the foods you eat and your activity levels. This can help reduce the effects of PCOS.  Keep your weight under control.  Eat foods that are low in carbohydrate and high in fiber.  Exercise regularly. SEEK MEDICAL CARE IF:  Your symptoms do not get better with medicine.  You have new symptoms. Document Released: 02/06/2005 Document Revised: 08/03/2013 Document Reviewed: 03/31/2013 [facility] Patient Information 2015 [employer] [place]. This information  is not intended to replace advice given to you by your health care provider. Make sure you discuss any questions you have with your health care provider.

## 2014-07-26 DIAGNOSIS — L680 Hirsutism: Secondary | ICD-10-CM | POA: Insufficient documentation

## 2014-07-26 LAB — TESTOSTERONE, FREE, TOTAL, SHBG
SEX HORMONE BINDING: 71 nmol/L (ref 18–114)
Testosterone, Free: 4.4 pg/mL (ref 0.6–6.8)
Testosterone-% Free: 1.1 % (ref 0.4–2.4)
Testosterone: 41 ng/dL (ref 10–70)

## 2014-07-26 LAB — PROLACTIN: PROLACTIN: 3.6 ng/mL

## 2014-07-26 LAB — HEMOGLOBIN A1C
HEMOGLOBIN A1C: 5.5 % (ref <5.7)
MEAN PLASMA GLUCOSE: 111 mg/dL (ref <117)

## 2014-07-26 LAB — DHEA-SULFATE: DHEA SO4: 131 ug/dL (ref 18–391)

## 2014-07-26 LAB — TSH: TSH: 1.017 u[IU]/mL (ref 0.350–4.500)

## 2014-07-26 LAB — T3, FREE: T3, Free: 2.8 pg/mL (ref 2.3–4.2)

## 2014-07-26 LAB — T4, FREE: FREE T4: 0.96 ng/dL (ref 0.80–1.80)

## 2014-07-26 LAB — VITAMIN D 25 HYDROXY (VIT D DEFICIENCY, FRACTURES): Vit D, 25-Hydroxy: 49 ng/mL (ref 30–89)

## 2014-07-29 LAB — 17-HYDROXYPROGESTERONE: 17-OH-Progesterone, LC/MS/MS: 13 ng/dL

## 2014-07-30 LAB — ANDROSTENEDIONE: Androstenedione: 93 ng/dL

## 2014-08-08 ENCOUNTER — Telehealth: Payer: Self-pay | Admitting: Family Medicine

## 2014-08-08 DIAGNOSIS — K900 Celiac disease: Secondary | ICD-10-CM

## 2014-08-08 NOTE — Telephone Encounter (Signed)
Please let her know - advise we see her here to eval for her concerns for celiac as usually workup for this starts with labs which we could do as usually takes a while to get in with Dr. Loreta AveMann.

## 2014-08-08 NOTE — Telephone Encounter (Signed)
I called the pt and she stated she feels she may have celiac disease due to history of recurrent symptoms such as recurrent conjunctivitis, acne, etc. Message forwarded to Dr Selena BattenKim.

## 2014-08-08 NOTE — Telephone Encounter (Signed)
I have not had any of my patients be unhappy with Dr. Elvera LennoxGherghe. What question would she have for Dr. Loreta AveMann that was not answered by Dr. Elvera LennoxGherghe - is referral for the posisble PCOS? -  so can place referral?

## 2014-08-08 NOTE — Telephone Encounter (Signed)
[facility]

## 2014-08-08 NOTE — Telephone Encounter (Signed)
Dr Brett AlbinoKim-Per Marchelle FolksAmanda the pt told her she did not like Dr Elvera LennoxGherghe and wanted to see Dr Loreta AveMann instead.

## 2014-08-08 NOTE — Telephone Encounter (Signed)
Pt states she would prefer to have an appt with Dr Loreta AveMann instead and this was approved by Dr Selena BattenKim. Referral placed to Dr Loreta AveMann and the pt is aware someone will call with an appt.

## 2014-08-08 NOTE — Telephone Encounter (Signed)
Can you find out what the referral if for? Thanks.

## 2014-08-10 ENCOUNTER — Ambulatory Visit: Payer: BC Managed Care – PPO | Admitting: Family Medicine

## 2014-08-31 ENCOUNTER — Encounter: Payer: Self-pay | Admitting: Gastroenterology

## 2014-09-07 ENCOUNTER — Other Ambulatory Visit: Payer: Self-pay | Admitting: Gastroenterology

## 2014-09-07 DIAGNOSIS — R1012 Left upper quadrant pain: Secondary | ICD-10-CM

## 2014-09-12 ENCOUNTER — Ambulatory Visit
Admission: RE | Admit: 2014-09-12 | Discharge: 2014-09-12 | Disposition: A | Discharge status: Home / Self Care | Payer: BC Managed Care – PPO | Source: Ambulatory Visit | Attending: Gastroenterology | Admitting: Gastroenterology

## 2014-09-12 DIAGNOSIS — R1012 Left upper quadrant pain: Secondary | ICD-10-CM

## 2014-09-12 MED ORDER — IOHEXOL 300 MG/ML  SOLN
100.0000 mL | Freq: Once | INTRAMUSCULAR | Status: AC | PRN
  Administered 2014-09-12: 100 mL via INTRAVENOUS

## 2014-09-13 ENCOUNTER — Telehealth: Payer: Self-pay | Admitting: Obstetrics & Gynecology

## 2014-09-13 NOTE — Telephone Encounter (Signed)
lmtcb to schedule incoming MD referral with Dr. Hyacinth MeekerMiller or Dr. Edward JollySilva.

## 2014-11-09 ENCOUNTER — Encounter: Payer: Self-pay | Admitting: Obstetrics & Gynecology

## 2014-11-09 ENCOUNTER — Ambulatory Visit (INDEPENDENT_AMBULATORY_CARE_PROVIDER_SITE_OTHER): Payer: BLUE CROSS/BLUE SHIELD | Admitting: Obstetrics & Gynecology

## 2014-11-09 VITALS — BP 108/66 | HR 78 | Resp 18 | Ht 69.0 in | Wt 128.0 lb

## 2014-11-09 DIAGNOSIS — L680 Hirsutism: Secondary | ICD-10-CM

## 2014-11-09 DIAGNOSIS — N938 Other specified abnormal uterine and vaginal bleeding: Secondary | ICD-10-CM

## 2014-11-09 DIAGNOSIS — E282 Polycystic ovarian syndrome: Secondary | ICD-10-CM

## 2014-11-09 DIAGNOSIS — Z0000 Encounter for general adult medical examination without abnormal findings: Secondary | ICD-10-CM

## 2014-11-09 LAB — POCT URINALYSIS DIPSTICK
Urobilinogen, UA: NEGATIVE
pH, UA: 5

## 2014-11-09 NOTE — Progress Notes (Signed)
29 y.o. G0P0000 SingleCaucasianF here for new patient exam and for evaluation for possible PCOS.  Pt has been on oral contraceptives for about three or four years.  Cycles before that time were longer and heavier with increased cramping.  Pt reports never missing a cycle in a month.  Pt saw Dr. Elvera LennoxGherghe in September and was switched to Seasonale.  She just finished the first "pack" of Seasonale.  Reports she things this has really helped with her anxiety and significant mood swings.  Not really sure it has helped with her "excessive hair growth".   Pt has seen Dr. Kriste BasqueHannah Kim and Dr. Elvera LennoxGherghe and Dr. Loreta AveMann.  Notes reviewed in EPIC.  Entire battery of tests, especially hormonal testing, was all in normal ranges.  Pt aware.  Dr. Elvera LennoxGherghe still felt pt may have PCOS so OCPs changed and spironolactone discussed.    Biggest complaints are cystic acne and hirsutism.  Pt has tried waxing and shaving and depilatories in the past.  She is allergic to depilatory creams so this doesn't work for her.  Uses make up for concealing facial blemishes.  Endocrinologist:  Dr. Elvera LennoxGherghe PCP:  Dr. Kriste BasqueHannah Kim  Patient's last menstrual period was 11/06/2014.          Sexually active: Yes.    The current method of family planning is OCP (estrogen/progesterone) and condoms most of the time.    Exercising: Yes.    running, dance, zumba  Smoker:  no  Health Maintenance: Pap:  10/28/11 WNL  History of abnormal Pap:  no TDaP:  2009 Screening Labs: yes , Hb today: 12.0 , Urine today: Trace Leuks, Trace Protein   reports that she has never smoked. She does not have any smokeless tobacco history on file. She reports that she drinks about 1.2 - 1.8 oz of alcohol per week.  Past Medical History  Diagnosis Date  . UTI (lower urinary tract infection)   . Depression     on antidepressants in the past, never SI or hospitalization  . UTI (lower urinary tract infection)   . Anxiety     Past Surgical History  Procedure Laterality  Date  . Tympanostomy tube placement  Age 75   . Eye surgery  2006   . Wisdom tooth extraction  2004     Current Outpatient Prescriptions  Medication Sig Dispense Refill  . cyclobenzaprine (FLEXERIL) 10 MG tablet Take 10 mg by mouth as needed.  1  . fluvoxaMINE (LUVOX) 50 MG tablet Take 100 mg by mouth 2 (two) times daily.    . Levonorgestrel-Ethinyl Estradiol (AMETHIA,CAMRESE) 0.15-0.03 &0.01 MG tablet Take 1 tablet by mouth daily. 1 Package 4   No current facility-administered medications for this visit.    Family History  Problem Relation Age of Onset  . Adopted: Yes  . Kidney disease Maternal Aunt     ROS:  Pertinent items are noted in HPI.  Otherwise, a comprehensive ROS was negative.  Exam:   BP 108/66 mmHg  Pulse 78  Resp 18  Ht 5\' 9"  (1.753 m)  Wt 128 lb (58.06 kg)  BMI 18.89 kg/m2  LMP 11/06/2014    Height: 5\' 9"  (175.3 cm)  Ht Readings from Last 3 Encounters:  11/09/14 5\' 9"  (1.753 m)  07/25/14 5\' 9"  (1.753 m)  07/06/14 5' 9.5" (1.765 m)    General appearance: alert, cooperative and appears stated age Head: Normocephalic, without obvious abnormality, atraumatic Neurologic: Grossly normal  Pelvic: No pelvic exam performed today  A:  Hirsutism Possible PCOS, although labs all normal Depression Cystic acne  P:   D/W pt using OCPs continuously, all the time, to help with mood swings.  She will consider. Return for PUS to assess ovaries Pt will need Pap smear, as well.  Will do with next visit. Name given for dermatologist.  Feel pt would benefit from dermatology care as well.  ~30 minutes spent with patient >50% of time was in face to face discussion of above.

## 2014-11-10 LAB — HEMOGLOBIN, FINGERSTICK: HEMOGLOBIN, FINGERSTICK: 12 g/dL (ref 12.0–16.0)

## 2014-11-23 ENCOUNTER — Ambulatory Visit (INDEPENDENT_AMBULATORY_CARE_PROVIDER_SITE_OTHER): Payer: BLUE CROSS/BLUE SHIELD | Admitting: Obstetrics & Gynecology

## 2014-11-23 ENCOUNTER — Ambulatory Visit (INDEPENDENT_AMBULATORY_CARE_PROVIDER_SITE_OTHER): Payer: BLUE CROSS/BLUE SHIELD

## 2014-11-23 VITALS — BP 116/74 | Ht 69.0 in | Wt 127.0 lb

## 2014-11-23 DIAGNOSIS — N8320 Unspecified ovarian cysts: Secondary | ICD-10-CM

## 2014-11-23 DIAGNOSIS — N938 Other specified abnormal uterine and vaginal bleeding: Secondary | ICD-10-CM

## 2014-11-23 DIAGNOSIS — N6452 Nipple discharge: Secondary | ICD-10-CM

## 2014-11-23 DIAGNOSIS — L680 Hirsutism: Secondary | ICD-10-CM

## 2014-11-23 DIAGNOSIS — N83202 Unspecified ovarian cyst, left side: Secondary | ICD-10-CM

## 2014-11-23 DIAGNOSIS — Z124 Encounter for screening for malignant neoplasm of cervix: Secondary | ICD-10-CM

## 2014-11-23 NOTE — Progress Notes (Signed)
29 y.o. G0 Singlefemale here for a pelvic ultrasound.  Pt seen due to increased body hair and possible PCOS.  She does not have irregular cycles.  Currently she is on seasonal but before going on that had fairly regular cycles.  She has never missed a cycle, even before being on OCPs.  Pt has seen Dr. Elvera LennoxGherghe and spironolactone was discussed as well.      Patient's last menstrual period was 11/06/2014.  Sexually active:  Yes.   Contraception:  OCPs   FINDINGS: UTERUS: 8.4 x 5.4 x 3.6cm EMS: 9.181mm ADNEXA:   Left ovary 3.1 x 2.9 x 1.8cm with collapsed corpus luteal cyst 2.1cm   Right ovary 2.8 x 2.2 x 2.1cm with follicles ~1cm.   CUL DE SAC: no free fluid  D/w pt findings and images reviewed.  Ovaries on ultrasound do not meet criteria for PCOS using any of conventional criteria (NIH Consensus form 1990, Excess Androgen Society, or [place] Criteria from 2003).  With normal cycles, before being on OCPs, the only criteria that she meets for PCOS is excessive androgen/hirsutism finding if using [place] Criteria from 2003.  This is only 1 ourof 3 criteria and therefore, she ultimately does not meet criteria for PCOS.  Reassured pt that OCP use and, if she ultimately decides to proceed, spironolactone as well for hirsutism treatment.  Pt then states she has felt some recent increased swelling beneath clavicle on right side and has noted occasional clear nipple discharge on right side.  Pt has not had complete gyn exam and Pap in several years.  Recommended proceeding today.    Exam:   BP 116/74 mmHg  Ht 5\' 9"  (1.753 m)  Wt 127 lb (57.607 kg)  BMI 18.75 kg/m2  LMP 11/06/2014    Height: 5\' 9"  (175.3 cm)  Ht Readings from Last 3 Encounters:  11/23/14 5\' 9"  (1.753 m)  11/09/14 5\' 9"  (1.753 m)  07/25/14 5\' 9"  (1.753 m)    General appearance: alert, cooperative and appears stated age Head: Normocephalic, without obvious abnormality, atraumatic Neck: no adenopathy, supple, symmetrical, trachea  midline and thyroid normal to inspection and palpation Breasts: normal appearance, no masses or tenderness , cannot illicit nipple discharge Abdomen: soft, non-tender; bowel sounds normal; no masses,  no organomegaly Extremities: extremities normal, atraumatic, no cyanosis or edema Skin: Skin color, texture, turgor normal. No rashes or lesions Lymph nodes: Cervical, supraclavicular, and axillary nodes normal. No abnormal inguinal nodes palpated Neurologic: Grossly normal   Pelvic: External genitalia:  no lesions              Urethra:  normal appearing urethra with no masses, tenderness or lesions              Bartholins and Skenes: normal                 Vagina: normal appearing vagina with normal color and discharge, no lesions              Cervix: no lesions              Pap taken: Yes.   Bimanual Exam:  Uterus:  normal size, contour, position, consistency, mobility, non-tender              Adnexa: normal adnexa and no mass, fullness, tenderness               Rectovaginal: Confirms               Anus:  normal  sphincter tone, no lesions  Chaperone was present for exam.  A:  Hirsutism  Normal gyn exam  Clear nipple discharge not illicited on physical exma today  "swelling" noted by pt beneath clavicle on right.  Physical exam is normal today to me.  P:   Pt clearly understands findings do not meet PCOS criteria however OCP use and spironolactone use is appropriate, if she decides to proceed.  Plan for pt to return for am prolactin level  Ok to continue OCPs  No indication on physical exam for breast imaging at this time  Will send copy of note to Dr. Elvera Lennox  ~40 minutes spent with patient >50% of time was in face to face discussion of above.

## 2014-11-26 ENCOUNTER — Encounter: Payer: Self-pay | Admitting: Obstetrics & Gynecology

## 2014-11-26 NOTE — Addendum Note (Signed)
Addended by: Jerene BearsMILLER, Takerra Lupinacci S on: 11/26/2014 09:35 PM   Modules accepted: Orders

## 2014-11-30 LAB — IPS PAP TEST WITH REFLEX TO HPV

## 2014-12-11 ENCOUNTER — Other Ambulatory Visit: Payer: BLUE CROSS/BLUE SHIELD

## 2014-12-13 ENCOUNTER — Other Ambulatory Visit (INDEPENDENT_AMBULATORY_CARE_PROVIDER_SITE_OTHER): Payer: BLUE CROSS/BLUE SHIELD

## 2014-12-13 ENCOUNTER — Other Ambulatory Visit: Payer: Self-pay | Admitting: Obstetrics & Gynecology

## 2014-12-13 DIAGNOSIS — N6452 Nipple discharge: Secondary | ICD-10-CM

## 2014-12-14 LAB — PROLACTIN: Prolactin: 7.1 ng/mL

## 2014-12-18 ENCOUNTER — Telehealth: Payer: Self-pay | Admitting: Emergency Medicine

## 2014-12-18 NOTE — Telephone Encounter (Signed)
-----   Message from Annamaria BootsMary Suzanne Miller, MD sent at 12/15/2014 10:25 AM EST ----- Inform this is normal.  If pt has any nipple discharge that she sees, she needs to call for appt for evaluation, same day if possible.

## 2014-12-18 NOTE — Telephone Encounter (Signed)
Call to patient. ""the mailbox is full and cannot accept any new messages at this time. Goodbye". Will have to try again.

## 2014-12-21 NOTE — Telephone Encounter (Signed)
Call to patient. ""the mailbox is full and cannot accept any new messages at this time. Goodbye". Will have to try again.   

## 2014-12-25 ENCOUNTER — Encounter: Payer: Self-pay | Admitting: Emergency Medicine

## 2014-12-25 NOTE — Telephone Encounter (Signed)
Call to patient. ""the mailbox is full and cannot accept any new messages at this time. Goodbye". Will have to try again.       Letter mailed to request patient call our office.

## 2014-12-25 NOTE — Telephone Encounter (Signed)
Spoke with patient and message from Dr. Hyacinth MeekerMiller given. Patient verbalized understanding, she will call with any further nipple discharge for appointment. Routing to provider for final review. Patient agreeable to disposition. Will close encounter

## 2014-12-30 IMAGING — CT CT ABD-PELV W/ CM
3 of 4 series · 13 of 36 positions shown, 19 images · IV contrast (READICAT/WATER & [ID] OMNI 300)
Comparison: None.

CLINICAL DATA: Left lower quadrant pain radiates to the back.
Nausea and diarrhea. Symptoms are chronic with increasing severity
over the past few months.

EXAM:
CT ABDOMEN AND PELVIS WITH CONTRAST
TECHNIQUE: Multidetector CT imaging of the abdomen and pelvis was performed
using the standard protocol following bolus administration of
intravenous contrast.
CONTRAST:  100mL OMNIPAQUE IOHEXOL 300 MG/ML  SOLN

[Series 3: abd/pelvis with · axial · 0.70mm/px · z∈[-356,-40]mm · 8 of 82 slices shown, 13 images]
[im 10/82  soft-tissue]
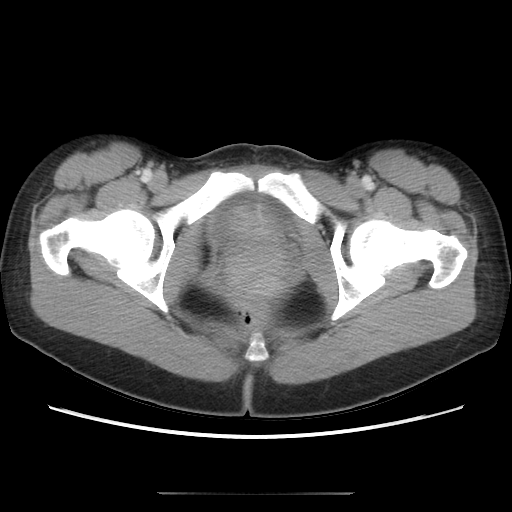
[im 10/82  bone]
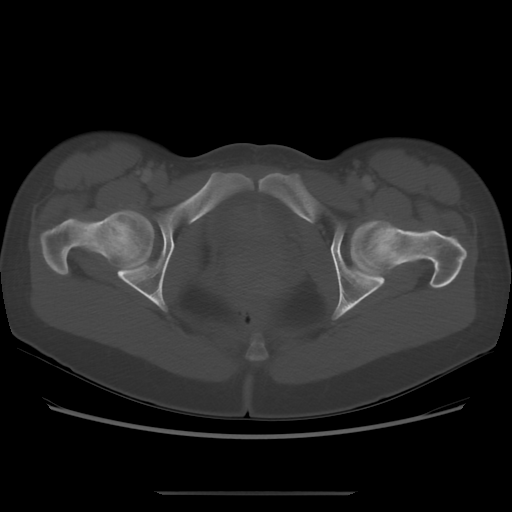
[im 19/82  soft-tissue]
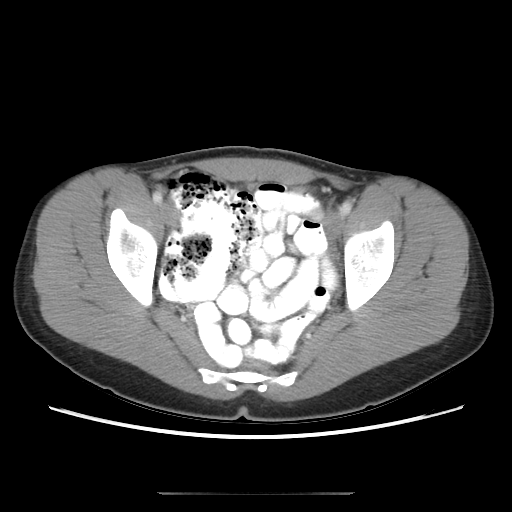
[im 28/82  soft-tissue]
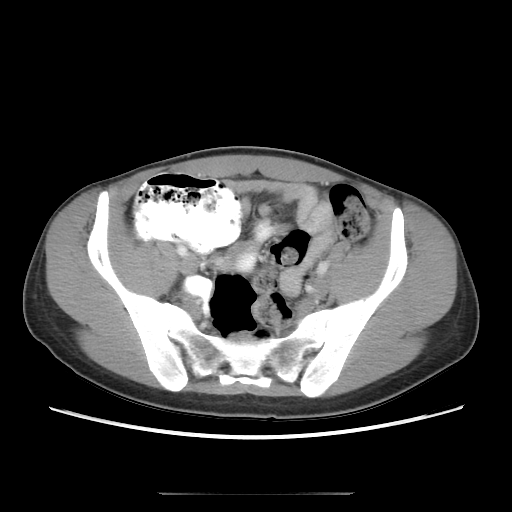
[im 37/82  soft-tissue]
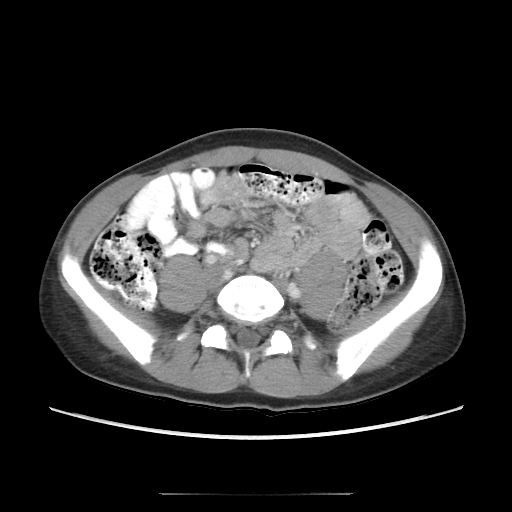
[im 46/82  soft-tissue]
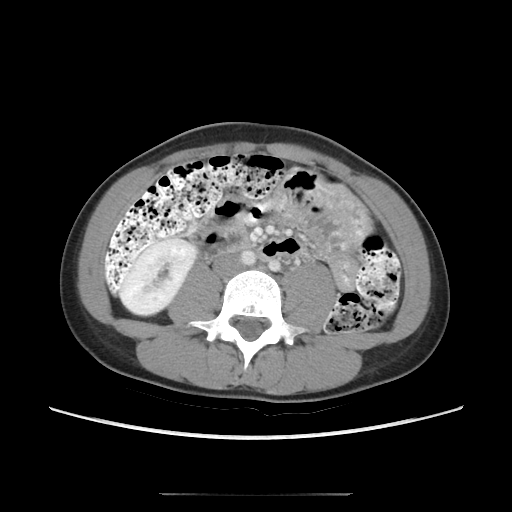
[im 46/82  lung]
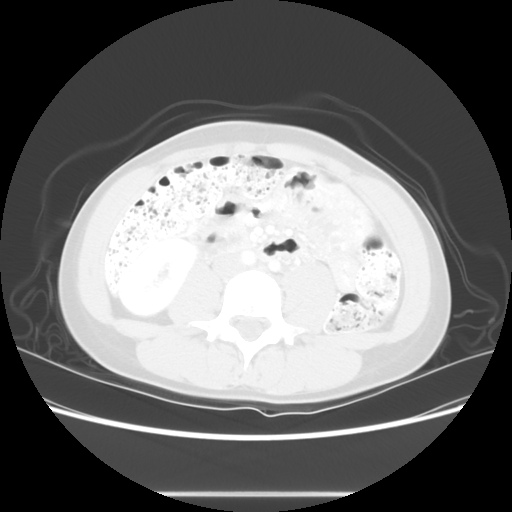
[im 55/82  soft-tissue]
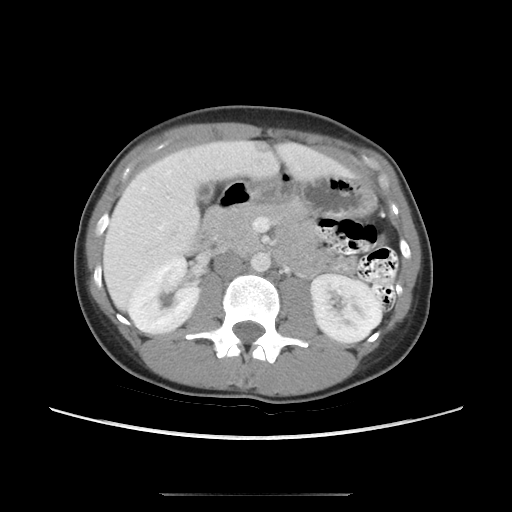
[im 55/82  lung]
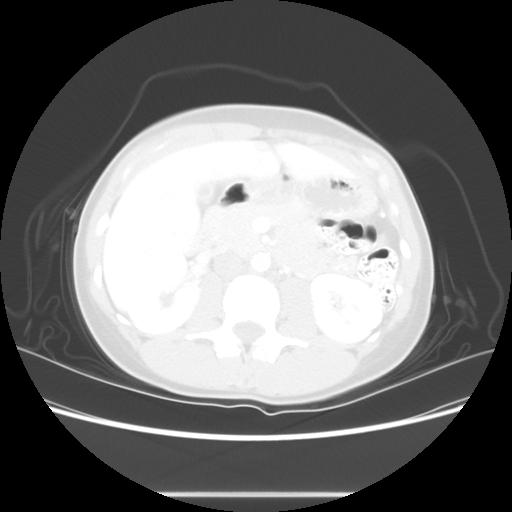
[im 64/82  soft-tissue]
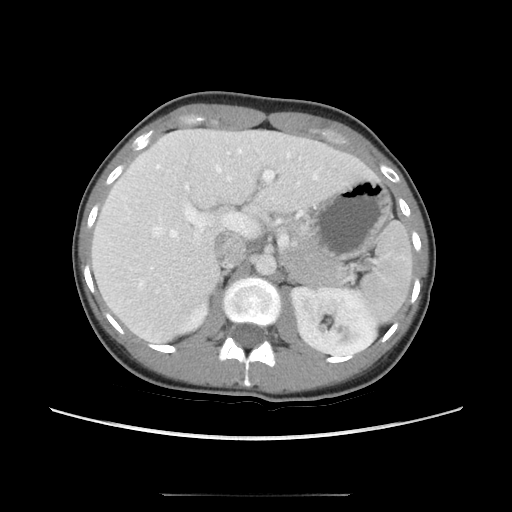
[im 64/82  lung]
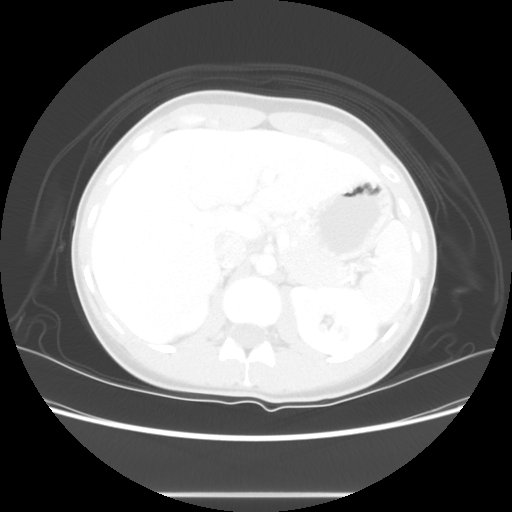
[im 73/82  soft-tissue]
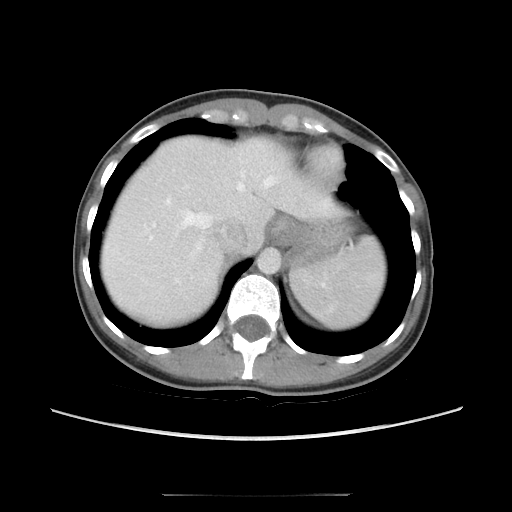
[im 73/82  lung]
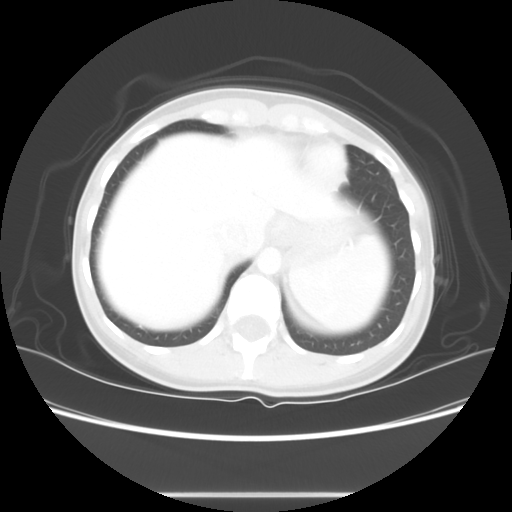

[Series 601: coronal body · coronal · 0.83mm/px · 1 of 102 slices shown, 2 images]
[im 34/102  soft-tissue]
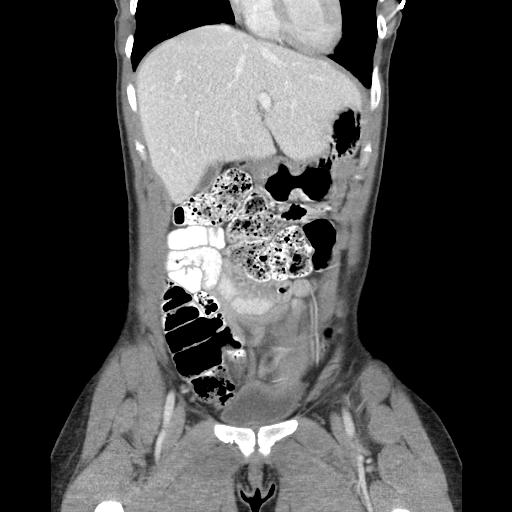
[im 34/102  bone]
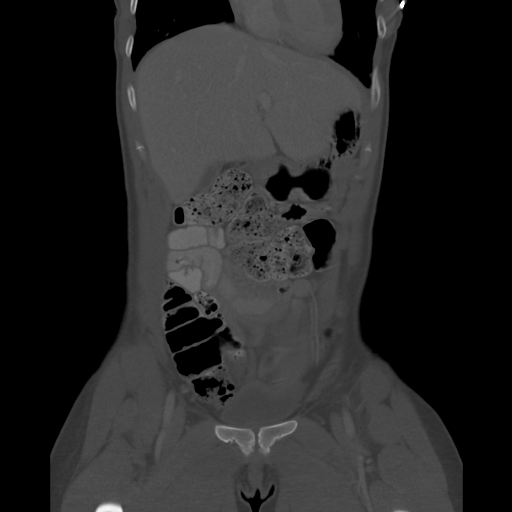

[Series 602: sagittal body · sagittal · 0.83mm/px · 4 of 145 slices shown]
[im 17/145  soft-tissue]
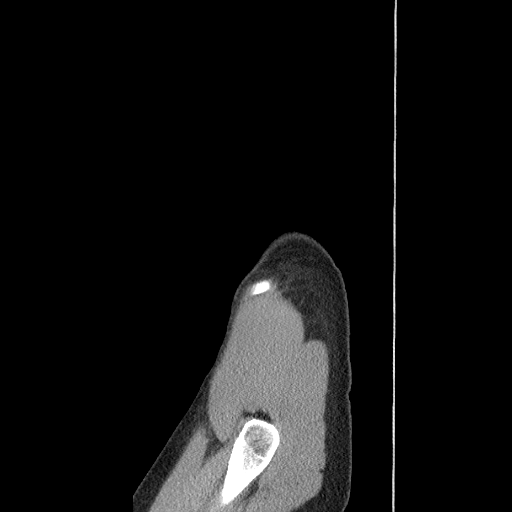
[im 33/145  soft-tissue]
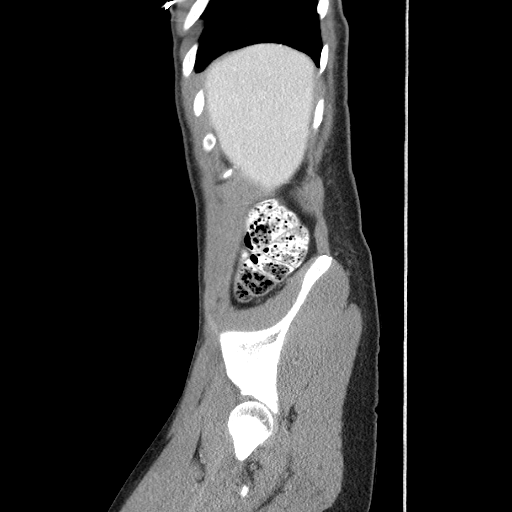
[im 49/145  soft-tissue]
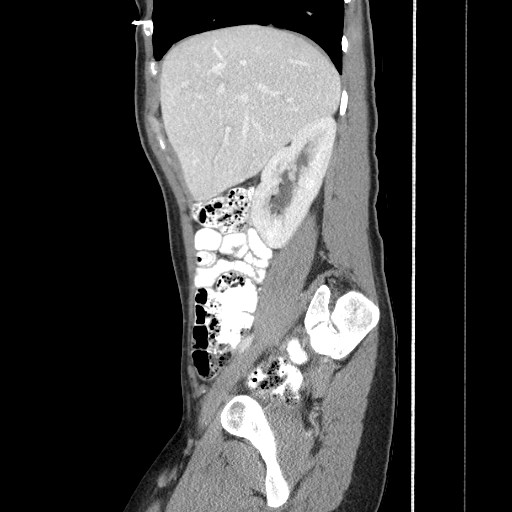
[im 65/145  soft-tissue]
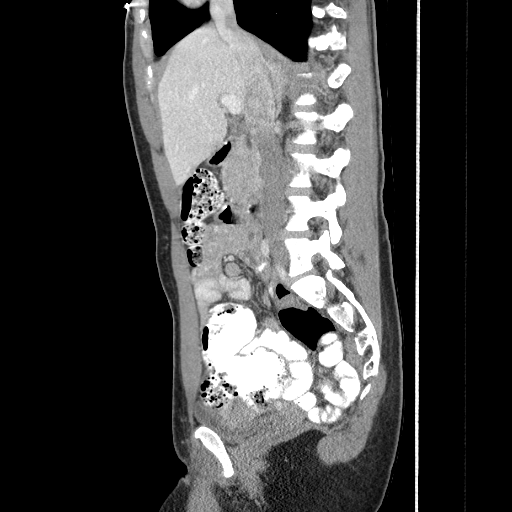

[13 of 36 positions shown; findings below may reference images not displayed]

FINDINGS: Lower chest: Lung bases show no acute findings. Heart size normal.
No pericardial or pleural effusion.

Hepatobiliary: Liver and gallbladder are unremarkable. No biliary
ductal dilatation.

Pancreas: Negative.

Spleen: Negative.

Adrenals/Urinary Tract: Adrenal glands and kidneys are unremarkable.
Ureters are decompressed. Bladder is low in volume.

Stomach/Bowel: Stomach and small bowel are unremarkable. Appendix is
not readily visualized. Stool is seen in the majority of the colon
which is otherwise unremarkable.

Vascular/Lymphatic: Vascular structures are unremarkable. No
pathologically enlarged lymph nodes.

Reproductive: Uterus and ovaries are visualized.

Other: No free fluid. Does not areas and peritoneum are
unremarkable.

Musculoskeletal: No worrisome lytic or sclerotic lesions.
IMPRESSION: 1. No acute findings to explain the patient's symptoms.
2. Stool in the majority of the colon is indicative of constipation.

## 2015-01-15 ENCOUNTER — Other Ambulatory Visit: Payer: Self-pay | Admitting: Family Medicine

## 2015-01-16 NOTE — Telephone Encounter (Signed)
Needs appt. Can refill to appointment.

## 2015-01-16 NOTE — Telephone Encounter (Signed)
Unable to reach pt as voicemail is full.

## 2015-01-16 NOTE — Telephone Encounter (Signed)
Unable to leave message as voice mail is full.

## 2015-01-17 NOTE — Telephone Encounter (Signed)
Left a message for the pt to return my call.  

## 2015-01-22 ENCOUNTER — Telehealth: Payer: Self-pay | Admitting: Family Medicine

## 2015-01-22 NOTE — Telephone Encounter (Signed)
Refill request for Luvox 50 mg take 2 po qhs and send to [facility].

## 2015-01-22 NOTE — Telephone Encounter (Signed)
Rx done and I left a message for the pt to return my call as the pt needs an appt.

## 2015-01-23 ENCOUNTER — Ambulatory Visit: Payer: BC Managed Care – PPO | Admitting: Internal Medicine

## 2015-02-25 ENCOUNTER — Other Ambulatory Visit: Payer: Self-pay | Admitting: Family Medicine

## 2015-02-26 NOTE — Telephone Encounter (Signed)
Needs appointment in the next 1 month. No refills until appointment scheduled. Once appointment scheduled can refill to appointment only.

## 2015-06-19 ENCOUNTER — Other Ambulatory Visit: Payer: Self-pay | Admitting: Family Medicine

## 2015-06-21 ENCOUNTER — Ambulatory Visit (INDEPENDENT_AMBULATORY_CARE_PROVIDER_SITE_OTHER): Payer: BLUE CROSS/BLUE SHIELD | Admitting: Family Medicine

## 2015-06-21 ENCOUNTER — Encounter: Payer: Self-pay | Admitting: Family Medicine

## 2015-06-21 VITALS — BP 90/62 | HR 89 | Temp 98.8°F | Ht 69.0 in | Wt 126.1 lb

## 2015-06-21 DIAGNOSIS — F418 Other specified anxiety disorders: Secondary | ICD-10-CM

## 2015-06-21 DIAGNOSIS — K589 Irritable bowel syndrome without diarrhea: Secondary | ICD-10-CM | POA: Diagnosis not present

## 2015-06-21 DIAGNOSIS — F419 Anxiety disorder, unspecified: Secondary | ICD-10-CM

## 2015-06-21 DIAGNOSIS — F329 Major depressive disorder, single episode, unspecified: Secondary | ICD-10-CM

## 2015-06-21 MED ORDER — FLUVOXAMINE MALEATE 50 MG PO TABS: ORAL_TABLET | ORAL | Status: DC

## 2015-06-21 NOTE — Patient Instructions (Signed)
BEFORE YOU LEAVE: -FODMAP diet -follow up in 3-4 months  citracel or metameucil daily; mirilax 1-2 times daily when you feel stopped up for 1-2 weeks to clean out  Try one month off dairy to see if this helps, then can work with items on fodmap diet to see if any of this is helpful  Restart luvox  daily at night for one week then  nightly if needed

## 2015-06-21 NOTE — Progress Notes (Signed)
Pre visit review using our clinic review tool, if applicable. No additional management support is needed unless otherwise documented below in the visit note. 

## 2015-06-21 NOTE — Progress Notes (Signed)
HPI:  Anxiety and Depression: -on luvox prior to establishing with me and requested this -i have not seen her in awhile, did not f/u as advised -reports: was trying a lower dose but if tries to stop or decrease dose becomes emotional or irritable -denies: depression, thoughts of self harm  Chronic intestinal discomfort: -started as a child -she saw GI for this -reports had extensive work up last year with Dr Loreta Ave and told likely IBS -she gets constipation often -not taking anything for this  ROS: See pertinent positives and negatives per HPI.  Past Medical History  Diagnosis Date  . UTI (lower urinary tract infection)   . Depression     on antidepressants in the past, never SI or hospitalization  . UTI (lower urinary tract infection)   . Anxiety     Past Surgical History  Procedure Laterality Date  . Tympanostomy tube placement  Age 29   . Eye surgery  2006   . Wisdom tooth extraction  2004     Family History  Problem Relation Age of Onset  . Adopted: Yes  . Kidney disease Maternal Aunt     Social History   Social History  . Marital Status: Single    Spouse Name: N/A  . Number of Children: N/A  . Years of Education: N/A   Social History Main Topics  . Smoking status: Never Smoker   . Smokeless tobacco: None  . Alcohol Use: 1.2 - 1.8 oz/week    2-3 Standard drinks or equivalent per week     Comment: rare alchol; 2 drinks a few times per week  . Drug Use: None  . Sexual Activity:    Partners: Male    Birth Control/ Protection: Condom, Pill     Comment: one partner   Other Topics Concern  . None   Social History Narrative   Work or School: works at [employer] Situation: lives with roommate      Spiritual Beliefs: Christian      Lifestyle: regular CV exercise 3-4 times per week; good diet              Current outpatient prescriptions:  .  fluvoxaMINE (LUVOX) 50 MG tablet, TAKE 2 TABLETS (100 MG TOTAL) BY MOUTH AT BEDTIME.,  Disp: 60 tablet, Rfl: 3 .  Levonorgestrel-Ethinyl Estradiol (AMETHIA,CAMRESE) 0.15-0.03 &0.01 MG tablet, Take 1 tablet by mouth daily., Disp: 1 Package, Rfl: 4  EXAM:  Filed Vitals:   06/21/15 1306  BP: 90/62  Pulse: 89  Temp: 98.8 F (37.1 C)    Body mass index is 18.61 kg/(m^2).  GENERAL: vitals reviewed and listed above, alert, oriented, appears well hydrated and in no acute distress  HEENT: atraumatic, conjunttiva clear, no obvious abnormalities on inspection of external nose and ears  NECK: no obvious masses on inspection  LUNGS: clear to auscultation bilaterally, no wheezes, rales or rhonchi, good air movement  CV: HRRR, no peripheral edema  MS: moves all extremities without noticeable abnormality  PSYCH: pleasant and cooperative, no obvious depression or anxiety  ASSESSMENT AND PLAN:  Discussed the following assessment and plan:  Anxiety and depression -restart luvox, discussed other options but she feels this works well for her -follow up 3-4 months  IBS (irritable bowel syndrome) -discussed lifestyle and pharmacological options - mostly constipaion -opted to try trial of dairy, FODMAPs trial, daily fiber supplement and mirilax prn, follow up with GI if persists  -Patient advised to return or notify  a doctor immediately if symptoms worsen or persist or new concerns arise.  Patient Instructions  BEFORE YOU LEAVE: -FODMAP diet -follow up in 3-4 months  citracel or metameucil daily; mirilax 1-2 times daily when you feel stopped up for 1-2 weeks to clean out  Try one month off dairy to see if this helps, then can work with items on fodmap diet to see if any of this is helpful  Restart luvox  daily at night for one week then  nightly if needed     Loyce Klasen R.

## 2015-09-16 ENCOUNTER — Other Ambulatory Visit: Payer: Self-pay | Admitting: Internal Medicine

## 2016-08-01 ENCOUNTER — Telehealth: Payer: Self-pay | Admitting: Obstetrics & Gynecology

## 2016-08-01 ENCOUNTER — Ambulatory Visit (INDEPENDENT_AMBULATORY_CARE_PROVIDER_SITE_OTHER): Payer: BLUE CROSS/BLUE SHIELD | Admitting: Nurse Practitioner

## 2016-08-01 ENCOUNTER — Encounter: Payer: Self-pay | Admitting: Nurse Practitioner

## 2016-08-01 VITALS — BP 122/68 | HR 76 | Temp 98.8°F | Ht 69.0 in | Wt 130.0 lb

## 2016-08-01 DIAGNOSIS — N760 Acute vaginitis: Secondary | ICD-10-CM | POA: Diagnosis not present

## 2016-08-01 DIAGNOSIS — Z308 Encounter for other contraceptive management: Secondary | ICD-10-CM | POA: Diagnosis not present

## 2016-08-01 MED ORDER — FLUCONAZOLE 150 MG PO TABS: 150.0000 mg | ORAL_TABLET | Freq: Once | ORAL | 0 refills | Status: AC

## 2016-08-01 MED ORDER — DROSPIRENONE-ETHINYL ESTRADIOL 3-0.02 MG PO TABS: 1.0000 | ORAL_TABLET | Freq: Every day | ORAL | 1 refills | Status: DC

## 2016-08-01 NOTE — Progress Notes (Signed)
Encounter reviewed by Dr. Leather Estis Amundson C. Silva.  

## 2016-08-01 NOTE — Progress Notes (Signed)
30 y.o. Single Caucasian female G0P0000 here with complaint of vaginal symptoms of itching, burning, and increase discharge. Describes discharge as clear to yellow thick. Onset of symptoms 3 days ago. Denies new personal products or vaginal dryness. Some STD concerns with a new partner for a week. Urinary symptoms none . Contraception is condoms.  LMP:  07/21/16.  Will be having AEX in November and will get rest of STD's.  Most recent OCP including Seasonale - did not like not having a regular cycle monthly when she was SA.    Camrese gave her really bad mood changes.  Sounds like she gets very emotional with her cycles.  Some suggestion that she may have PCOS - PUS 10/2014 was very normal and did not meet the criteria.   O:  Healthy female WDWN Affect: normal, orientation x 3  Exam: no distress but anxious Abdomen: soft and non tender, no flank pain Lymph node: no enlargement or tenderness Pelvic exam: External genital: normal female BUS: negative Vagina: white to yellow and thick discharge noted.  Affirm taken. Cervix: normal, non tender, no CMT Uterus: normal, non tender Adnexa:normal, non tender, no masses or fullness noted    A: Vaginitis  R/O STD - GC & Chl only  Contraception needs   P: Discussed findings of vaginitis and etiology. Discussed Aveeno or baking soda sitz bath for comfort. Avoid moist clothes or pads for extended period of time. If working out in gym clothes or swim suits for long periods of time change underwear or bottoms of swimsuit if possible. Olive Oil/Coconut Oil use for skin protection prior to activity can be used to external skin.  Rx: Diflucan 150 mg   Follow with [facility]   RX for Yaz 1 pack with 1 refill until she returns on 09/15/16 RV prn

## 2016-08-01 NOTE — Telephone Encounter (Signed)
Spoke with patient. Patient reports that for 2 days she has been experiencing vaginal itching, discomfort, and yellow vaginal discharge with odor. Having pain with intercourse. Denies any pain at this time. Denies fever, chills, nausea, or vomiting. Advised she will need to be seen in the office for further evaluation. Patient is agreeable. Appointment scheduled for today at 3 pm with Ria CommentPatricia Grubb, FNP. Patient is agreeable to date, time, and to see another provider.  Cc: Dr.Miller  Patient agreeable to disposition. Will close encounter.

## 2016-08-01 NOTE — Telephone Encounter (Signed)
Patient called and said, "I am having pain in my vaginal area mostly during sex. I'd like to come in and see Dr. Hyacinth MeekerMiller."  Last seen: 11/23/14

## 2016-08-01 NOTE — Patient Instructions (Signed)
Oral Contraception Information Oral contraceptive pills (OCPs) are medicines taken to prevent pregnancy. OCPs work by preventing the ovaries from releasing eggs. The hormones in OCPs also cause the cervical mucus to thicken, preventing the sperm from entering the uterus. The hormones also cause the uterine lining to become thin, not allowing a fertilized egg to attach to the inside of the uterus. OCPs are highly effective when taken exactly as prescribed. However, OCPs do not prevent sexually transmitted diseases (STDs). Safe sex practices, such as using condoms along with the pill, can help prevent STDs.  Before taking the pill, you may have a physical exam and Pap test. Your health care provider may order blood tests. The health care provider will make sure you are a good candidate for oral contraception. Discuss with your health care provider the possible side effects of the OCP you may be prescribed. When starting an OCP, it can take 2 to 3 months for the body to adjust to the changes in hormone levels in your body.  TYPES OF ORAL CONTRACEPTION  The combination pill--This pill contains estrogen and progestin (synthetic progesterone) hormones. The combination pill comes in 21-day, 28-day, or 91-day packs. Some types of combination pills are meant to be taken continuously (365-day pills). With 21-day packs, you do not take pills for 7 days after the last pill. With 28-day packs, the pill is taken every day. The last 7 pills are without hormones. Certain types of pills have more than 21 hormone-containing pills. With 91-day packs, the first 84 pills contain both hormones, and the last 7 pills contain no hormones or contain estrogen only.  The minipill--This pill contains the progesterone hormone only. The pill is taken every day continuously. It is very important to take the pill at the same time each day. The minipill comes in packs of 28 pills. All 28 pills contain the hormone.  ADVANTAGES OF ORAL  CONTRACEPTIVE PILLS  Decreases premenstrual symptoms.   Treats menstrual period cramps.   Regulates the menstrual cycle.   Decreases a heavy menstrual flow.   May treatacne, depending on the type of pill.   Treats abnormal uterine bleeding.   Treats polycystic ovarian syndrome.   Treats endometriosis.   Can be used as emergency contraception.  THINGS THAT CAN MAKE ORAL CONTRACEPTIVE PILLS LESS EFFECTIVE OCPs can be less effective if:   You forget to take the pill at the same time every day.   You have a stomach or intestinal disease that lessens the absorption of the pill.   You take OCPs with other medicines that make OCPs less effective, such as antibiotics, certain HIV medicines, and some seizure medicines.   You take expired OCPs.   You forget to restart the pill on day 7, when using the packs of 21 pills.  RISKS ASSOCIATED WITH ORAL CONTRACEPTIVE PILLS  Oral contraceptive pills can sometimes cause side effects, such as:  Headache.  Nausea.  Breast tenderness.  Irregular bleeding or spotting. Combination pills are also associated with a small increased risk of:  Blood clots.  Heart attack.  Stroke.   This information is not intended to replace advice given to you by your health care provider. Make sure you discuss any questions you have with your health care provider.   Document Released: 01/03/2003 Document Revised: 08/03/2013 Document Reviewed: 04/03/2013 Elsevier Interactive Patient Education 2016 [employer]   With Yaz start the pack of pills on Friday after menses.

## 2016-08-02 ENCOUNTER — Other Ambulatory Visit: Payer: Self-pay | Admitting: Nurse Practitioner

## 2016-08-02 LAB — WET PREP BY MOLECULAR PROBE
Candida species: POSITIVE — AB
Gardnerella vaginalis: POSITIVE — AB
TRICHOMONAS VAG: NEGATIVE

## 2016-08-02 MED ORDER — METRONIDAZOLE 0.75 % VA GEL: 1.0000 | Freq: Every day | VAGINAL | 0 refills | Status: DC

## 2016-08-05 ENCOUNTER — Telehealth: Payer: Self-pay | Admitting: *Deleted

## 2016-08-05 LAB — IPS N GONORRHOEA AND CHLAMYDIA BY PCR

## 2016-08-05 NOTE — Telephone Encounter (Signed)
I have attempted to contact this patient by phone with the following results: left message to return call to [name] at [phone] on answering machine (mobile per [facility]).  Name verified in message, advised call was regarding recent labs.  [phone] (Mobile)

## 2016-08-05 NOTE — Telephone Encounter (Signed)
-----   Message from Ria CommentPatricia Grubb, FNP sent at 08/05/2016  8:34 AM EDT ----- Results via my chart:  Destiny Wade,  The GC & Chlamydia is negative.  If questions or concerns call back.

## 2016-08-06 NOTE — Telephone Encounter (Signed)
Patient reviewed MyChart results on 08/05/16 @ 11:04 am.  To call with any questions.  Closing encounter.

## 2016-09-15 ENCOUNTER — Encounter: Payer: Self-pay | Admitting: Obstetrics & Gynecology

## 2016-09-15 ENCOUNTER — Ambulatory Visit (INDEPENDENT_AMBULATORY_CARE_PROVIDER_SITE_OTHER): Payer: BLUE CROSS/BLUE SHIELD | Admitting: Obstetrics & Gynecology

## 2016-09-15 VITALS — BP 100/60 | HR 72 | Resp 20 | Ht 68.5 in | Wt 131.0 lb

## 2016-09-15 DIAGNOSIS — Z01419 Encounter for gynecological examination (general) (routine) without abnormal findings: Secondary | ICD-10-CM | POA: Diagnosis not present

## 2016-09-15 MED ORDER — ETONOGESTREL-ETHINYL ESTRADIOL 0.12-0.015 MG/24HR VA RING: VAGINAL_RING | VAGINAL | 12 refills | Status: DC

## 2016-09-15 NOTE — Progress Notes (Signed)
30 y.o. G0P0000 SingleCaucasianF here for annual exam.  I haven't seen pt since Jan, 2016.  Pt reports she is still having GI issues.  Hasn't seen Dr. Elvera LennoxGherghe since I saw the pt in 2016.  Reports she did try gluten free for about a year and this didn't make any difference.    Pt reports she is really "bad" at taking her pills regularly.  She would like to try a different method.    Patient's last menstrual period was 09/15/2016.          Sexually active: No.  The current method of family planning is none and condoms every time.    Exercising: Yes.    running Smoker:  no  Health Maintenance: Pap:  11/23/14 Neg   History of abnormal Pap:  no MMG: Never TDaP:  10/2007  Screening Labs: Declines, Urine today: Declines   reports that she has never smoked. She has never used smokeless tobacco. She reports that she drinks about 1.2 - 1.8 oz of alcohol per week . She reports that she does not use drugs.  Past Medical History:  Diagnosis Date  . Anxiety   . Depression    on antidepressants in the past, never SI or hospitalization  . UTI (lower urinary tract infection)   . UTI (lower urinary tract infection)     Past Surgical History:  Procedure Laterality Date  . eye surgery  2006   . TYMPANOSTOMY TUBE PLACEMENT  Age 58   . WISDOM TOOTH EXTRACTION  2004     Current Outpatient Prescriptions  Medication Sig Dispense Refill  . drospirenone-ethinyl estradiol (YAZ,GIANVI,LORYNA) 3-0.02 MG tablet Take 1 tablet by mouth daily. (Patient not taking: Reported on 09/15/2016) 1 Package 1   No current facility-administered medications for this visit.     Family History  Problem Relation Age of Onset  . Adopted: Yes  . Kidney disease Maternal Aunt     ROS:  Pertinent items are noted in HPI.  Otherwise, a comprehensive ROS was negative.  Exam:   BP 100/60 (BP Location: Right Arm, Patient Position: Sitting, Cuff Size: Normal)   Pulse 72   Resp 20   Ht 5' 8.5" (1.74 m)   Wt 131 lb (59.4 kg)    LMP 09/15/2016   BMI 19.63 kg/m   Weight change: +4#    Height: 5' 8.5" (174 cm)  Ht Readings from Last 3 Encounters:  09/15/16 5' 8.5" (1.74 m)  08/01/16 5\' 9"  (1.753 m)  06/21/15 5\' 9"  (1.753 m)    General appearance: alert, cooperative and appears stated age Head: Normocephalic, without obvious abnormality, atraumatic Neck: no adenopathy, supple, symmetrical, trachea midline and thyroid normal to inspection and palpation Lungs: clear to auscultation bilaterally Breasts: normal appearance, no masses or tenderness Heart: regular rate and rhythm Abdomen: soft, non-tender; bowel sounds normal; no masses,  no organomegaly Extremities: extremities normal, atraumatic, no cyanosis or edema Skin: Skin color, texture, turgor normal. No rashes or lesions Lymph nodes: Cervical, supraclavicular, and axillary nodes normal. No abnormal inguinal nodes palpated Neurologic: Grossly normal  Pelvic: External genitalia:  no lesions              Urethra:  normal appearing urethra with no masses, tenderness or lesions              Bartholins and Skenes: normal                 Vagina: normal appearing vagina with normal color and discharge, no  lesions              Cervix: no lesions              Pap taken: No. Bimanual Exam:  Uterus:  normal size, contour, position, consistency, mobility, non-tender              Adnexa: normal adnexa and no mass, fullness, tenderness               Rectovaginal: Confirms               Anus:  normal sphincter tone, no lesions  Chaperone was present for exam.  A:  Well Woman with normal exam Irregular bleeding with missed pills, desires change +SA  P:   Mammogram guidelines reviewed Pap smear neg 1/16 Did have STD testing 10/17 with GC/Chl, Trich.  Declines blood work today. return annually or prn

## 2017-07-16 ENCOUNTER — Encounter: Payer: Self-pay | Admitting: Family Medicine

## 2017-08-13 ENCOUNTER — Other Ambulatory Visit: Payer: Self-pay | Admitting: Obstetrics & Gynecology

## 2017-08-13 NOTE — Telephone Encounter (Signed)
Medication refill request: Nuvaring  Last AEX:  09/15/16 SM Next AEX: none Last MMG (if hormonal medication request): none Refill authorized: 09/15/16 #1each/12R to [facility]

## 2017-08-22 ENCOUNTER — Other Ambulatory Visit: Payer: Self-pay | Admitting: Obstetrics & Gynecology

## 2017-08-24 NOTE — Telephone Encounter (Signed)
This request came through four hours ago.  I'm just now doing it but it is completed.  Please let pt know.

## 2017-08-24 NOTE — Telephone Encounter (Signed)
Patient called to check on the status of the refill request. 

## 2017-08-24 NOTE — Telephone Encounter (Signed)
Call to patient to let her know prescription for nuvaring had been sent to pharmacy on file. Patient verbalized understanding.   Encounter closed.

## 2017-08-24 NOTE — Telephone Encounter (Signed)
Medication refill request: nuvaring  Last AEX:  09/15/16 SM  Next AEX: 11/05/17  Last MMG (if hormonal medication request): n/a Refill authorized: 09/15/16 #1, 12 RF

## 2017-10-15 ENCOUNTER — Ambulatory Visit (HOSPITAL_COMMUNITY): Payer: Self-pay

## 2017-10-15 ENCOUNTER — Ambulatory Visit (HOSPITAL_COMMUNITY)
Admission: EM | Admit: 2017-10-15 | Discharge: 2017-10-15 | Disposition: A | Discharge status: Home / Self Care | Payer: Self-pay | Attending: Family Medicine | Admitting: Family Medicine

## 2017-10-15 ENCOUNTER — Encounter (HOSPITAL_COMMUNITY): Payer: Self-pay | Admitting: Family Medicine

## 2017-10-15 DIAGNOSIS — M542 Cervicalgia: Secondary | ICD-10-CM
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
## 2017-11-05 ENCOUNTER — Other Ambulatory Visit: Payer: Self-pay

## 2017-11-05 ENCOUNTER — Ambulatory Visit (INDEPENDENT_AMBULATORY_CARE_PROVIDER_SITE_OTHER): Payer: 59 | Admitting: Obstetrics & Gynecology

## 2017-11-05 ENCOUNTER — Encounter: Payer: Self-pay | Admitting: Family Medicine

## 2017-11-05 ENCOUNTER — Other Ambulatory Visit (HOSPITAL_COMMUNITY)
Admission: RE | Admit: 2017-11-05 | Discharge: 2017-11-05 | Disposition: A | Discharge status: Home / Self Care | Payer: 59 | Source: Ambulatory Visit | Attending: Obstetrics & Gynecology | Admitting: Obstetrics & Gynecology

## 2017-11-05 ENCOUNTER — Encounter: Payer: Self-pay | Admitting: Obstetrics & Gynecology

## 2017-11-05 VITALS — BP 110/70 | HR 64 | Resp 16 | Ht 68.75 in | Wt 126.0 lb

## 2017-11-05 DIAGNOSIS — Z124 Encounter for screening for malignant neoplasm of cervix: Secondary | ICD-10-CM

## 2017-11-05 DIAGNOSIS — Z01419 Encounter for gynecological examination (general) (routine) without abnormal findings: Secondary | ICD-10-CM

## 2017-11-05 DIAGNOSIS — Z8659 Personal history of other mental and behavioral disorders: Secondary | ICD-10-CM

## 2017-11-05 DIAGNOSIS — Z202 Contact with and (suspected) exposure to infections with a predominantly sexual mode of transmission: Secondary | ICD-10-CM

## 2017-11-05 MED ORDER — FLUVOXAMINE MALEATE 50 MG PO TABS: 50.0000 mg | ORAL_TABLET | Freq: Every day | ORAL | 2 refills | Status: DC

## 2017-11-05 MED ORDER — ETONOGESTREL-ETHINYL ESTRADIOL 0.12-0.015 MG/24HR VA RING: 1.0000 | VAGINAL_RING | VAGINAL | 4 refills | Status: DC
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
## 2017-11-08 DIAGNOSIS — Z8659 Personal history of other mental and behavioral disorders: Secondary | ICD-10-CM | POA: Insufficient documentation

## 2017-11-09 LAB — CYTOLOGY - PAP
Chlamydia: NEGATIVE
Diagnosis: NEGATIVE
HPV (WINDOPATH): NOT DETECTED
NEISSERIA GONORRHEA: NEGATIVE

## 2018-10-25 ENCOUNTER — Ambulatory Visit: Payer: 59 | Admitting: Family Medicine

## 2018-10-25 ENCOUNTER — Encounter: Payer: Self-pay | Admitting: Family Medicine

## 2018-10-25 VITALS — BP 94/60 | HR 85 | Temp 99.3°F | Resp 16 | Ht 68.0 in | Wt 129.0 lb

## 2018-10-25 DIAGNOSIS — H6692 Otitis media, unspecified, left ear: Secondary | ICD-10-CM

## 2018-10-25 MED ORDER — DOXYCYCLINE HYCLATE 100 MG PO TABS: 100.0000 mg | ORAL_TABLET | Freq: Two times a day (BID) | ORAL | 0 refills | Status: DC

## 2018-10-25 MED ORDER — FLUTICASONE PROPIONATE 50 MCG/ACT NA SUSP: 2.0000 | Freq: Every day | NASAL | 6 refills | Status: DC

## 2018-10-25 NOTE — Patient Instructions (Signed)
Start doxycyline today, every 12 hours for 10 days.  Start flonase nasal spray daily.    Otitis Media, Adult  Otitis media means that the middle ear is red and swollen (inflamed) and full of fluid. The condition usually goes away on its own. Follow these instructions at home:  Take over-the-counter and prescription medicines only as told by your doctor.  If you were prescribed an antibiotic medicine, take it as told by your doctor. Do not stop taking the antibiotic even if you start to feel better.  Keep all follow-up visits as told by your doctor. This is important. Contact a doctor if:  You have bleeding from your nose.  There is a lump on your neck.  You are not getting better in 5 days.  You feel worse instead of better. Get help right away if:  You have pain that is not helped with medicine.  You have swelling, redness, or pain around your ear.  You get a stiff neck.  You cannot move part of your face (paralyzed).  You notice that the bone behind your ear hurts when you touch it.  You get a very bad headache. Summary  Otitis media means that the middle ear is red, swollen, and full of fluid.  This condition usually goes away on its own. In some cases, treatment may be needed.  If you were prescribed an antibiotic medicine, take it as told by your doctor. This information is not intended to replace advice given to you by your health care provider. Make sure you discuss any questions you have with your health care provider. Document Released: 03/31/2008 Document Revised: 11/03/2016 Document Reviewed: 11/03/2016 Elsevier Interactive Patient Education  2019 [employer]

## 2018-10-25 NOTE — Progress Notes (Signed)
Destiny Wade , 08-Jan-1986, 32 y.o., female MRN: [id number] Patient Care Team    Relationship Specialty Notifications Start End  Terressa KoyanagiKim, Hannah R, DO PCP - General Family Medicine  06/23/13     Chief Complaint  Patient presents with  . Ear Pain    x 1wk, difficultly hearing out of the Lt ear, has pain going down from her Lt ear down her neck and shoulder  . Nasal Congestion     Subjective: Pt presents for an OV with complaints of not feeling well for over a week. She reports having a fever, runny nose and left ear discomfort last week. However over the last 2 days her ear has become more painful and she reports her hearing is muffled and she is having pain that radiates down her neck into her chest from her ear.  She had ear infections as a child. She has been using mucinex and a sinus rinse to help her symptoms.    No flowsheet data found.  Allergies  Allergen Reactions  . Ceclor [Cefaclor]   . Penicillins     Unsure of reaction because was a baby  . Sulfa Antibiotics    Social History   Tobacco Use  . Smoking status: Never Smoker  . Smokeless tobacco: Never Used  Substance Use Topics  . Alcohol use: Yes    Alcohol/week: 2.0 - 3.0 standard drinks    Types: 2 - 3 Standard drinks or equivalent per week    Comment: rare alchol; 2 drinks a few times per week   Past Medical History:  Diagnosis Date  . Anxiety   . Depression    on antidepressants in the past, never SI or hospitalization  . UTI (lower urinary tract infection)    Past Surgical History:  Procedure Laterality Date  . eye surgery  2006   . TYMPANOSTOMY TUBE PLACEMENT  Age 13   . WISDOM TOOTH EXTRACTION  2004    Family History  Adopted: Yes  Problem Relation Age of Onset  . Kidney disease Maternal Aunt    Allergies as of 10/25/2018      Reactions   Ceclor [cefaclor]    Penicillins    Unsure of reaction because was a baby   Sulfa Antibiotics       Medication List       Accurate as of October 25, 2018  1:56 PM. Always use your most recent med list.        etonogestrel-ethinyl estradiol 0.12-0.015 MG/24HR vaginal ring Commonly known as:  NUVARING Place 1 each vaginally every 28 (twenty-eight) days. Insert vaginally and leave in place for 3 consecutive weeks, then remove for 1 week.   fluvoxaMINE 50 MG tablet Commonly known as:  LUVOX Take 1 tablet (50 mg total) by mouth at bedtime.       All past medical history, surgical history, allergies, family history, immunizations andmedications were updated in the EMR today and reviewed under the history and medication portions of their EMR.     ROS: Negative, with the exception of above mentioned in HPI   Objective:  BP 94/60 (BP Location: Right Arm, Patient Position: Sitting, Cuff Size: Normal)   Pulse 85   Temp 99.3 F (37.4 C) (Oral)   Resp 16   Ht 5\' 8"  (1.727 m)   Wt 129 lb (58.5 kg)   LMP 10/04/2018   BMI 19.61 kg/m  Body mass index is 19.61 kg/m. Gen: Afebrile. No acute distress. Nontoxic in appearance, well  developed, well nourished.  HENT: AT. [place]. Bilateral TM visualized left TM with erythema and bulging. Right TM normal. EAC normal bilateral.   MMM, no oral lesions. Bilateral nares mild drainage, no erythema, mild swelling. Throat without erythema or exudates. Mild cough on exam.  Eyes:Pupils Equal Round Reactive to light, Extraocular movements intact,  Conjunctiva without redness, discharge or icterus. Neck/lymp/endocrine: Supple,left ant cervical lymphadenopathy CV: RRR  Chest: CTAB, no wheeze or crackles. Good air movement, normal resp effort.  Neuro:  Normal gait. PERLA. EOMi. Alert. Oriented x3   No exam data present No results found. No results found for this or any previous visit (from the past 24 hour(s)).  Assessment/Plan: Destiny Wade is a 32 y.o. female present for OV for  Left otitis media, unspecified otitis media type Rest, hydrate.  + flonase, mucinex (DM if cough), nettie pot or nasal  saline.  Doxycyline (PCN- ceph allergic) prescribed, take until completed.  Caution provided with use of BCP and any abx use- condom use encouraged. F/U 2 weeks if not improved.    Reviewed expectations re: course of current medical issues.  Discussed self-management of symptoms.  Outlined signs and symptoms indicating need for more acute intervention.  Patient verbalized understanding and all questions were answered.  Patient received an After-Visit Summary.    No orders of the defined types were placed in this encounter.    Note is dictated utilizing voice recognition software. Although note has been proof read prior to signing, occasional typographical errors still can be missed. If any questions arise, please do not hesitate to call for verification.   electronically signed by:  Felix Pacinienee Kuneff, DO  [facility] [place]

## 2018-11-28 ENCOUNTER — Other Ambulatory Visit: Payer: Self-pay | Admitting: Obstetrics & Gynecology

## 2019-01-22 ENCOUNTER — Other Ambulatory Visit: Payer: Self-pay | Admitting: Obstetrics & Gynecology

## 2019-01-24 NOTE — Telephone Encounter (Signed)
Medication refill request: NuvaRing Last AEX:  11/05/17 SM Next AEX: none scheduled Last MMG (if hormonal medication request): n/a Refill authorized: Order pended for #3 w/ 0 refills if authorized.

## 2019-07-27 ENCOUNTER — Ambulatory Visit: Payer: BC Managed Care – PPO | Admitting: Certified Nurse Midwife

## 2019-08-15 ENCOUNTER — Other Ambulatory Visit: Payer: Self-pay

## 2019-08-15 ENCOUNTER — Ambulatory Visit: Payer: BC Managed Care – PPO | Admitting: Certified Nurse Midwife

## 2019-08-15 ENCOUNTER — Encounter: Payer: Self-pay | Admitting: Certified Nurse Midwife

## 2019-08-15 VITALS — BP 90/62 | HR 64 | Temp 97.2°F | Resp 16 | Ht 68.5 in | Wt 129.0 lb

## 2019-08-15 DIAGNOSIS — Z113 Encounter for screening for infections with a predominantly sexual mode of transmission: Secondary | ICD-10-CM

## 2019-08-15 DIAGNOSIS — Z23 Encounter for immunization: Secondary | ICD-10-CM | POA: Diagnosis not present

## 2019-08-15 DIAGNOSIS — Z3044 Encounter for surveillance of vaginal ring hormonal contraceptive device: Secondary | ICD-10-CM

## 2019-08-15 DIAGNOSIS — Z01419 Encounter for gynecological examination (general) (routine) without abnormal findings: Secondary | ICD-10-CM

## 2019-08-15 MED ORDER — ETONOGESTREL-ETHINYL ESTRADIOL 0.12-0.015 MG/24HR VA RING: VAGINAL_RING | VAGINAL | 4 refills | Status: DC

## 2019-08-15 NOTE — Progress Notes (Signed)
33 y.o. G0P0000 Single  Caucasian Fe here for annual exam. Periods occasional spotting with continuous use of Nuvaring for 3 months and normal bleeding occurrence. Sexually active, no change. Desires STD screening today, vaginal only. Sees [facility] if needed. 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 No SOB or chest pain. Eating well and feels she is hydrating well also.  No LMP recorded. (Menstrual status: Other).          Sexually active: Yes.    The current method of family planning is NuvaRing vaginal inserts.    Exercising: Yes.    gym, running, ice skating Smoker:  no  Review of Systems  Constitutional: Negative.   HENT: Negative.   Eyes: Negative.   Respiratory: Negative.   Cardiovascular: Negative.   Gastrointestinal: Negative.   Genitourinary: Negative.   Musculoskeletal: Negative.   Skin: Negative.   Neurological: Negative.   Endo/Heme/Allergies: Negative.   Psychiatric/Behavioral: Negative.     Health Maintenance: Pap:  11-23-14 neg, 11-05-17 neg HPV HR neg History of Abnormal Pap: no MMG:  none Self Breast exams: no Colonoscopy:  none BMD:   none TDaP:  2009 request update Shingles: no Pneumonia: no Hep C and HIV: not done Labs: if needed   reports that she has never smoked. She has never used smokeless tobacco. She reports current alcohol use of about 2.0 - 3.0 standard drinks of alcohol per week. She reports that she does not use drugs.  Past Medical History:  Diagnosis Date  . Anxiety   . Depression    on antidepressants in the past, never SI or hospitalization  . UTI (lower urinary tract infection)     Past Surgical History:  Procedure Laterality Date  . eye surgery  2006   . TYMPANOSTOMY TUBE PLACEMENT  Age 64   . WISDOM TOOTH EXTRACTION  2004     Current Outpatient Medications  Medication Sig Dispense Refill  . fluticasone (FLONASE) 50 MCG/ACT nasal spray Place 2  sprays into both nostrils daily. 16 g 6  . NUVARING 0.12-0.015 MG/24HR vaginal ring INSERT VAGINALLY AND LEAVE IN PLACE FOR 3 CONSECUTIVE WEEKS, THEN REMOVE FOR 1 WEEK. 3 each 0   No current facility-administered medications for this visit.     Family History  Adopted: Yes  Problem Relation Age of Onset  . Kidney disease Maternal Aunt     ROS:  Pertinent items are noted in HPI.  Otherwise, a comprehensive ROS was negative.  Exam:   BP 90/62   Pulse 64   Temp (!) 97.2 F (36.2 C) (Skin)   Resp 16   Ht 5' 8.5" (1.74 m)   Wt 129 lb (58.5 kg)   BMI 19.33 kg/m  Height: 5' 8.5" (174 cm) Ht Readings from Last 3 Encounters:  08/15/19 5' 8.5" (1.74 m)  10/25/18 5\' 8"  (1.727 m)  11/05/17 5' 8.75" (1.746 m)    General appearance: alert, cooperative and appears stated age Head: Normocephalic, without obvious abnormality, atraumatic Neck: no adenopathy, supple, symmetrical, trachea midline and thyroid normal to inspection and palpation Lungs: clear to auscultation bilaterally Breasts: normal appearance, no masses or tenderness, No nipple retraction or dimpling, No nipple discharge or bleeding, No axillary or supraclavicular adenopathy Heart: regular rate and rhythm Abdomen: soft, non-tender; no masses,  no organomegaly Extremities: extremities normal, atraumatic, no cyanosis or edema Skin: Skin color, texture, turgor normal. No rashes or lesions Lymph nodes: Cervical, supraclavicular, and axillary  nodes normal. No abnormal inguinal nodes palpated Neurologic: Grossly normal   Pelvic: External genitalia:  no lesions              Urethra:  normal appearing urethra with no masses, tenderness or lesions              Bartholin's and Skene's: normal                 Vagina: normal appearing vagina with normal color and discharge, no lesions              Cervix: no cervical motion tenderness, no lesions and normal appearance              Pap taken: No. Bimanual Exam:  Uterus:  normal  size, contour, position, consistency, mobility, non-tender and anteverted              Adnexa: normal adnexa and no mass, fullness, tenderness               Rectovaginal: Confirms               Anus:  normal sphincter tone, no lesions  Chaperone present: yes  A:  Well Woman with normal exam  Contraception continuous Nuvaring use  STD screening  ?Anxiety with exercise  Immunization update  P:   Reviewed health and wellness pertinent to exam  Discussed if spotting occurs with continuous use to go ahead and have period and then re-insert new Nuvaring. Warning signs/risks/benefits discussed.  Rx Nuvaring see order with instructions.  Labs: Gc/Chlamydia, Affirm  Discussed slowing down to a good walking pace and look around to enjoy the outdoor surroundings to see if this resolves. Warning signs given and need to advise.  Requests TDAP  Pap smear: no   counseled on breast self exam, STD prevention, HIV risk factors and prevention, feminine hygiene, adequate intake of calcium and vitamin D, diet and exercise  return annually or prn  An After Visit Summary was printed and given to the patient.

## 2019-08-15 NOTE — Patient Instructions (Signed)
General topics  Next pap or exam is  due in 1 year Take a Women's multivitamin Take 1200 mg. of calcium daily - prefer dietary If any concerns in interim to call back  Breast Self-Awareness Practicing breast self-awareness may pick up problems early, prevent significant medical complications, and possibly save your life. By practicing breast self-awareness, you can become familiar with how your breasts look and feel and if your breasts are changing. This allows you to notice changes early. It can also offer you some reassurance that your breast health is good. One way to learn what is normal for your breasts and whether your breasts are changing is to do a breast self-exam. If you find a lump or something that was not present in the past, it is best to contact your caregiver right away. Other findings that should be evaluated by your caregiver include nipple discharge, especially if it is bloody; skin changes or reddening; areas where the skin seems to be pulled in (retracted); or new lumps and bumps. Breast pain is seldom associated with cancer (malignancy), but should also be evaluated by a caregiver. BREAST SELF-EXAM The best time to examine your breasts is 5 7 days after your menstrual period is over.  ExitCare Patient Information 2013 [place].   Exercise to Stay Healthy Exercise helps you become and stay healthy. EXERCISE IDEAS AND TIPS Choose exercises that:  You enjoy.  Fit into your day. You do not need to exercise really hard to be healthy. You can do exercises at a slow or medium level and stay healthy. You can:  Stretch before and after working out.  Try yoga, Pilates, or tai chi.  Lift weights.  Walk fast, swim, jog, run, climb stairs, bicycle, dance, or rollerskate.  Take aerobic classes. Exercises that burn about 150 calories:  Running 1  miles in 15 minutes.  Playing volleyball for 45 to 60 minutes.  Washing and waxing a car for 45 to 60 minutes.   Playing touch football for 45 minutes.  Walking 1  miles in 35 minutes.  Pushing a stroller 1  miles in 30 minutes.  Playing basketball for 30 minutes.  Raking leaves for 30 minutes.  Bicycling 5 miles in 30 minutes.  Walking 2 miles in 30 minutes.  Dancing for 30 minutes.  Shoveling snow for 15 minutes.  Swimming laps for 20 minutes.  Walking up stairs for 15 minutes.  Bicycling 4 miles in 15 minutes.  Gardening for 30 to 45 minutes.  Jumping rope for 15 minutes.  Washing windows or floors for 45 to 60 minutes. Document Released: 11/15/2010 Document Revised: 01/05/2012 Document Reviewed: 11/15/2010 [facility] Patient Information 2013 [place].   Other topics ( that may be useful information):    Sexually Transmitted Disease Sexually transmitted disease (STD) refers to any infection that is passed from person to person during sexual activity. This may happen by way of saliva, semen, blood, vaginal mucus, or urine. Common STDs include:  Gonorrhea.  Chlamydia.  Syphilis.  HIV/AIDS.  Genital herpes.  Hepatitis B and C.  Trichomonas.  Human papillomavirus (HPV).  Pubic lice. CAUSES  An STD may be spread by bacteria, virus, or parasite. A person can get an STD by:  Sexual intercourse with an infected person.  Sharing sex toys with an infected person.  Sharing needles with an infected person.  Having intimate contact with the genitals, mouth, or rectal areas of an infected person. SYMPTOMS  Some people may not have any symptoms, but  they can still pass the infection to others. Different STDs have different symptoms. Symptoms include:  Painful or bloody urination.  Pain in the pelvis, abdomen, vagina, anus, throat, or eyes.  Skin rash, itching, irritation, growths, or sores (lesions). These usually occur in the genital or anal area.  Abnormal vaginal discharge.  Penile discharge in men.  Soft, flesh-colored skin growths in the genital  or anal area.  Fever.  Pain or bleeding during sexual intercourse.  Swollen glands in the groin area.  Yellow skin and eyes (jaundice). This is seen with hepatitis. DIAGNOSIS  To make a diagnosis, your caregiver may:  Take a medical history.  Perform a physical exam.  Take a specimen (culture) to be examined.  Examine a sample of discharge under a microscope.  Perform blood test TREATMENT   Chlamydia, gonorrhea, trichomonas, and syphilis can be cured with antibiotic medicine.  Genital herpes, hepatitis, and HIV can be treated, but not cured, with prescribed medicines. The medicines will lessen the symptoms.  Genital warts from HPV can be treated with medicine or by freezing, burning (electrocautery), or surgery. Warts may come back.  HPV is a virus and cannot be cured with medicine or surgery.However, abnormal areas may be followed very closely by your caregiver and may be removed from the cervix, vagina, or vulva through office procedures or surgery. If your diagnosis is confirmed, your recent sexual partners need treatment. This is true even if they are symptom-free or have a negative culture or evaluation. They should not have sex until their caregiver says it is okay. HOME CARE INSTRUCTIONS  All sexual partners should be informed, tested, and treated for all STDs.  Take your antibiotics as directed. Finish them even if you start to feel better.  Only take over-the-counter or prescription medicines for pain, discomfort, or fever as directed by your caregiver.  Rest.  Eat a balanced diet and drink enough fluids to keep your urine clear or pale yellow.  Do not have sex until treatment is completed and you have followed up with your caregiver. STDs should be checked after treatment.  Keep all follow-up appointments, Pap tests, and blood tests as directed by your caregiver.  Only use latex condoms and water-soluble lubricants during sexual activity. Do not use petroleum  jelly or oils.  Avoid alcohol and illegal drugs.  Get vaccinated for HPV and hepatitis. If you have not received these vaccines in the past, talk to your caregiver about whether one or both might be right for you.  Avoid risky sex practices that can break the skin. The only way to avoid getting an STD is to avoid all sexual activity.Latex condoms and dental dams (for oral sex) will help lessen the risk of getting an STD, but will not completely eliminate the risk. SEEK MEDICAL CARE IF:   You have a fever.  You have any new or worsening symptoms. Document Released: 01/03/2003 Document Revised: 01/05/2012 Document Reviewed: 01/10/2011 [facility] Patient Information 2013 [place].    Domestic Abuse You are being battered or abused if someone close to you hits, pushes, or physically hurts you in any way. You also are being abused if you are forced into activities. You are being sexually abused if you are forced to have sexual contact of any kind. You are being emotionally abused if you are made to feel worthless or if you are constantly threatened. It is important to remember that help is available. No one has the right to abuse you. PREVENTION OF FURTHER  ABUSE  Learn the warning signs of danger. This varies with situations but may include: the use of alcohol, threats, isolation from friends and family, or forced sexual contact. Leave if you feel that violence is going to occur.  If you are attacked or beaten, report it to the police so the abuse is documented. You do not have to press charges. The police can protect you while you or the attackers are leaving. Get the officer's name and badge number and a copy of the report.  Find someone you can trust and tell them what is happening to you: your caregiver, a nurse, clergy member, close friend or family member. Feeling ashamed is natural, but remember that you have done nothing wrong. No one deserves abuse. Document Released: 10/10/2000  Document Revised: 01/05/2012 Document Reviewed: 12/19/2010 [facility] Patient Information 2013 [place].    How Much is Too Much Alcohol? Drinking too much alcohol can cause injury, accidents, and health problems. These types of problems can include:   Car crashes.  Falls.  Family fighting (domestic violence).  Drowning.  Fights.  Injuries.  Burns.  Damage to certain organs.  Having a baby with birth defects. ONE DRINK CAN BE TOO MUCH WHEN YOU ARE:  Working.  Pregnant or breastfeeding.  Taking medicines. Ask your doctor.  Driving or planning to drive. If you or someone you know has a drinking problem, get help from a doctor.  Document Released: 08/09/2009 Document Revised: 01/05/2012 Document Reviewed: 08/09/2009 [facility] Patient Information 2013 [place].   Smoking Hazards Smoking cigarettes is extremely bad for your health. Tobacco smoke has over 200 known poisons in it. There are over 60 chemicals in tobacco smoke that cause cancer. Some of the chemicals found in cigarette smoke include:   Cyanide.  Benzene.  Formaldehyde.  Methanol (wood alcohol).  Acetylene (fuel used in welding torches).  Ammonia. Cigarette smoke also contains the poisonous gases nitrogen oxide and carbon monoxide.  Cigarette smokers have an increased risk of many serious medical problems and Smoking causes approximately:  90% of all lung cancer deaths in men.  80% of all lung cancer deaths in women.  90% of deaths from chronic obstructive lung disease. Compared with nonsmokers, smoking increases the risk of:  Coronary heart disease by 2 to 4 times.  Stroke by 2 to 4 times.  Men developing lung cancer by 23 times.  Women developing lung cancer by 13 times.  Dying from chronic obstructive lung diseases by 12 times.  . Smoking is the most preventable cause of death and disease in our society.  WHY IS SMOKING ADDICTIVE?  Nicotine is the chemical agent in  tobacco that is capable of causing addiction or dependence.  When you smoke and inhale, nicotine is absorbed rapidly into the bloodstream through your lungs. Nicotine absorbed through the lungs is capable of creating a powerful addiction. Both inhaled and non-inhaled nicotine may be addictive.  Addiction studies of cigarettes and spit tobacco show that addiction to nicotine occurs mainly during the teen years, when young people begin using tobacco products. WHAT ARE THE BENEFITS OF QUITTING?  There are many health benefits to quitting smoking.   Likelihood of developing cancer and heart disease decreases. Health improvements are seen almost immediately.  Blood pressure, pulse rate, and breathing patterns start returning to normal soon after quitting. QUITTING SMOKING   American Lung Association - 1-800-LUNGUSA  American Cancer Society - 1-800-ACS-2345 Document Released: 11/20/2004 Document Revised: 01/05/2012 Document Reviewed: 07/25/2009 [facility] Patient Information 2013 [place],  [place].   Stress Management Stress is a state of physical or mental tension that often results from changes in your life or normal routine. Some common causes of stress are:  Death of a loved one.  Injuries or severe illnesses.  Getting fired or changing jobs.  Moving into a new home. Other causes may be:  Sexual problems.  Business or financial losses.  Taking on a large debt.  Regular conflict with someone at home or at work.  Constant tiredness from lack of sleep. It is not just bad things that are stressful. It may be stressful to:  Win the lottery.  Get married.  Buy a new car. The amount of stress that can be easily tolerated varies from person to person. Changes generally cause stress, regardless of the types of change. Too much stress can affect your health. It may lead to physical or emotional problems. Too little stress (boredom) may also become stressful. SUGGESTIONS TO REDUCE  STRESS:  Talk things over with your family and friends. It often is helpful to share your concerns and worries. If you feel your problem is serious, you may want to get help from a professional counselor.  Consider your problems one at a time instead of lumping them all together. Trying to take care of everything at once may seem impossible. List all the things you need to do and then start with the most important one. Set a goal to accomplish 2 or 3 things each day. If you expect to do too many in a single day you will naturally fail, causing you to feel even more stressed.  Do not use alcohol or drugs to relieve stress. Although you may feel better for a short time, they do not remove the problems that caused the stress. They can also be habit forming.  Exercise regularly - at least 3 times per week. Physical exercise can help to relieve that "uptight" feeling and will relax you.  The shortest distance between despair and hope is often a good night's sleep.  Go to bed and get up on time allowing yourself time for appointments without being rushed.  Take a short "time-out" period from any stressful situation that occurs during the day. Close your eyes and take some deep breaths. Starting with the muscles in your face, tense them, hold it for a few seconds, then relax. Repeat this with the muscles in your neck, shoulders, hand, stomach, back and legs.  Take good care of yourself. Eat a balanced diet and get plenty of rest.  Schedule time for having fun. Take a break from your daily routine to relax. HOME CARE INSTRUCTIONS   Call if you feel overwhelmed by your problems and feel you can no longer manage them on your own.  Return immediately if you feel like hurting yourself or someone else. Document Released: 04/08/2001 Document Revised: 01/05/2012 Document Reviewed: 11/29/2007 [employer] Patient Information 2013 [employer].  Ethinyl Estradiol; Etonogestrel vaginal ring What is this  medicine? ETHINYL ESTRADIOL; ETONOGESTREL (ETH in il es tra DYE ole; et oh noe JES trel) vaginal ring is a flexible, vaginal ring used as a contraceptive (birth control method). This medicine combines 2 types of female hormones, an estrogen and a progestin. This ring is used to prevent ovulation and pregnancy. Each ring is effective for 1 month. This medicine may be used for other purposes; ask your health care provider or pharmacist if you have questions. COMMON BRAND NAME(S): EluRyng, NuvaRing What should I tell my health   care provider before I take this medicine? They need to know if you have any of these conditions:  abnormal vaginal bleeding  blood vessel disease or blood clots  breast, cervical, endometrial, ovarian, liver, or uterine cancer  diabetes  gallbladder disease  having surgery  heart disease or recent heart attack  high blood pressure  high cholesterol or triglycerides  history of irregular heartbeat or heart valve problems  kidney disease  liver disease  migraine headaches  protein C deficiency  protein S deficiency  recently had a baby, miscarriage, or abortion  stroke  systemic lupus erythematosus (SLE)  tobacco smoker  your age is more than 33 years old  an unusual or allergic reaction to estrogens, progestins, other medicines, foods, dyes, or preservatives  pregnant or trying to get pregnant  breast-feeding How should I use this medicine? Insert the ring into your vagina as directed. Follow the directions on the prescription label. The ring will remain place for 3 weeks and is then removed for a 1-week break. A new ring is inserted 1 week after the last ring was removed, on the same day of the week. Check often to make sure the ring is still in place. If the ring was out of the vagina for an unknown amount of time, you may not be protected from pregnancy. Perform a pregnancy test and call your doctor. Do not use more often than directed. A  patient package insert for the product will be given with each prescription and refill. Read this sheet carefully each time. The sheet may change frequently. Contact your pediatrician regarding the use of this medicine in children. Special care may be needed. Overdosage: If you think you have taken too much of this medicine contact a poison control center or emergency room at once. NOTE: This medicine is only for you. Do not share this medicine with others. What if I miss a dose? You will need to use the ring exactly as directed. It is very important to follow the schedule every cycle. If you do not use the ring as directed, you may not be protected from pregnancy. If the ring should slip out, is lost, or if you leave it in longer or shorter than you should, contact your health care professional for advice. What may interact with this medicine? Do not take this medicine with the following medications:  dasabuvir; ombitasvir; paritaprevir; ritonavir  ombitasvir; paritaprevir; ritonavir  vaginal lubricants or other vaginal products that are oil-based or silicone-based This medicine may also interact with the following medications:  acetaminophen  antibiotics or medicines for infections, especially rifampin, rifabutin, rifapentine, and griseofulvin, and possibly penicillins or tetracyclines  aprepitant or fosaprepitant  armodafinil  ascorbic acid (vitamin C)  barbiturate medicines, such as phenobarbital or primidone  bosentan  certain antiviral medicines for hepatitis, HIV or AIDS  certain medicines for cancer treatment  certain medicines for seizures like carbamazepine, clobazam, felbamate, lamotrigine, oxcarbazepine, phenytoin, rufinamide, topiramate  certain medicines for treating high cholesterol  cyclosporine  dantrolene  elagolix  flibanserin  grapefruit juice  lesinurad  medicines for diabetes  medicines to treat fungal infections, such as griseofulvin,  miconazole, fluconazole, ketoconazole, itraconazole, posaconazole or voriconazole  mifepristone  mitotane  modafinil  morphine  mycophenolate  St. John's wort  tamoxifen  temazepam  theophylline or aminophylline  thyroid hormones  tizanidine  tranexamic acid  ulipristal  warfarin This list may not describe all possible interactions. Give your health care provider a list of all the medicines, herbs,  non-prescription drugs, or dietary supplements you use. Also tell them if you smoke, drink alcohol, or use illegal drugs. Some items may interact with your medicine. What should I watch for while using this medicine? Visit your doctor or health care professional for regular checks on your progress. You will need a regular breast and pelvic exam and Pap smear while on this medicine. Check with your doctor or health care professional to see if you need an additional method of contraception during the first cycle that you use this ring. Female condoms (made with natural rubber latex, polyisoprene, and polyurethane) and spermicides may be used. Do not use a diaphragm, cervical cap, or a female condom, as the ring can interfere with these birth control methods and their proper placement. If you have any reason to think you are pregnant, stop using this medicine right away and contact your doctor or health care professional. If you are using this medicine for hormone related problems, it may take several cycles of use to see improvement in your condition. Smoking increases the risk of getting a blood clot or having a stroke while you are using hormonal birth control, especially if you are more than 33 years old. You are strongly advised not to smoke. Some women are prone to getting dark patches on the skin of the face (cholasma). Your risk of getting chloasma with this medicine is higher if you had chloasma during a pregnancy. Keep out of the sun. If you cannot avoid being in the sun, wear  protective clothing and use sunscreen. Do not use sun lamps or tanning beds/booths. This medicine can make your body retain fluid, making your fingers, hands, or ankles swell. Your blood pressure can go up. Contact your doctor or health care professional if you feel you are retaining fluid. If you are going to have elective surgery, you may need to stop using this medicine before the surgery. Consult your health care professional for advice. This medicine does not protect you against HIV infection (AIDS) or any other sexually transmitted diseases. What side effects may I notice from receiving this medicine? Side effects that you should report to your doctor or health care professional as soon as possible:  allergic reactions such as skin rash or itching, hives, swelling of the lips, mouth, tongue, or throat  depression  high blood pressure  migraines or severe, sudden headaches  signs and symptoms of a blood clot such as breathing problems; changes in vision; chest pain; severe, sudden headache; pain, swelling, warmth in the leg; trouble speaking; sudden numbness or weakness of the face, arm or leg  signs and symptoms of infection like fever or chills with dizziness and a sunburn-like rash, or pain or trouble passing urine  stomach pain  symptoms of vaginal infection like itching, irritation or unusual discharge  yellowing of the eyes or skin Side effects that usually do not require medical attention (report these to your doctor or health care professional if they continue or are bothersome):  acne  breast pain, tenderness  irregular vaginal bleeding or spotting, particularly during the first month of use  mild headache  nausea  painful periods  vomiting This list may not describe all possible side effects. Call your doctor for medical advice about side effects. You may report side effects to FDA at 1-800-FDA-1088. Where should I keep my medicine? Keep out of the reach of  children. Store unopened rings in the original foil pouch at room temperature between 20 and 25 degrees C (  68 and 77 degrees F) for up to 4 months. Protect from light. Do not store above 30 degrees C (86 degrees F). Throw away any unused medicine after the expiration date. A ring may only be used for 1 cycle (1 month). After the 3-week cycle, a used ring is removed and should be placed in the re-closable foil pouch and discarded in the trash out of reach of children and pets. Do NOT flush down the toilet. NOTE: This sheet is a summary. It may not cover all possible information. If you have questions about this medicine, talk to your doctor, pharmacist, or health care provider.  2020 Elsevier/Gold Standard (2017-06-12 14:41:10)

## 2019-08-16 LAB — GC/CHLAMYDIA PROBE AMP
Chlamydia trachomatis, NAA: NEGATIVE
Neisseria Gonorrhoeae by PCR: NEGATIVE

## 2019-08-16 LAB — VAGINITIS/VAGINOSIS, DNA PROBE
Candida Species: NEGATIVE
Gardnerella vaginalis: NEGATIVE
Trichomonas vaginosis: NEGATIVE

## 2019-10-05 ENCOUNTER — Ambulatory Visit (INDEPENDENT_AMBULATORY_CARE_PROVIDER_SITE_OTHER): Payer: BC Managed Care – PPO | Admitting: Psychology

## 2019-10-05 DIAGNOSIS — F411 Generalized anxiety disorder: Secondary | ICD-10-CM

## 2019-10-17 ENCOUNTER — Ambulatory Visit (INDEPENDENT_AMBULATORY_CARE_PROVIDER_SITE_OTHER): Payer: BC Managed Care – PPO | Admitting: Psychology

## 2019-10-17 DIAGNOSIS — F411 Generalized anxiety disorder: Secondary | ICD-10-CM | POA: Diagnosis not present

## 2019-10-31 ENCOUNTER — Ambulatory Visit (INDEPENDENT_AMBULATORY_CARE_PROVIDER_SITE_OTHER): Payer: BC Managed Care – PPO | Admitting: Psychology

## 2019-10-31 DIAGNOSIS — F411 Generalized anxiety disorder: Secondary | ICD-10-CM | POA: Diagnosis not present

## 2019-11-16 ENCOUNTER — Ambulatory Visit: Payer: BC Managed Care – PPO | Admitting: Psychology

## 2020-01-13 ENCOUNTER — Encounter: Payer: Self-pay | Admitting: Certified Nurse Midwife

## 2020-01-23 ENCOUNTER — Telehealth: Payer: Self-pay

## 2020-01-23 NOTE — Telephone Encounter (Signed)
Spoke with patient. Patient states that she is having a difficult time getting her Nuvaring filled. States that she was given her last rx on 12/27/2019 and is being told that she cannot have another rx filled until 02/02/2019. Reports her insurance is telling her that she needs a PA for a quantity override.

## 2020-01-23 NOTE — Telephone Encounter (Signed)
Patient called again with the phone number for her prior authorization. [phone] [facility]

## 2020-01-26 MED ORDER — ETONOGESTREL-ETHINYL ESTRADIOL 0.12-0.015 MG/24HR VA RING: VAGINAL_RING | VAGINAL | 0 refills | Status: DC

## 2020-01-26 NOTE — Telephone Encounter (Signed)
Call placed to [facility], spoke with [place]. Was advised PA not required, medication is covered under plan. Plan only allows 13 Nuvaring per 300 days. Will require appeal letter written for additional medication to be dispensed and faxed to [phone], ATTN Appeal Dept.    Call placed to patient. Advised as seen above.  Patient states she uses Nuvaring continuously, places 1 ring q3 wks. She is on her 4th week of current Nuvaring, needs to remove on 4/3. Unable to fill Nuvaring again until 02/02/20. Patient states she has always removed the Nuvaring and replaced it after the 3rd week, has read that it can be used for 4wks. "Occasionally" allows for menses, is unsure of last menses. Patient states she uses continuously active due to "crazy hormonal issues" week prior to menses.   Advised patient Nuvaring is approved for use for up to 4 wks. Reviewed with patient option of removing Nuvaring for 7 days and allow for menses and replacing or sending in Rx for 1 nuvaring and using [facility]. Patient is agreeable to provider recommendations.   Advised patient I will review with covering provider and f/u with recommendations.   Dr. Oscar La -please advise.

## 2020-01-26 NOTE — Telephone Encounter (Signed)
Reviewed with Dr. Oscar La.  Call returned to patient.  Reviewed option of removing ring for 7 days and allow for menses and then place new ring. Or can send in Rx for Nuvaring x1, reviewed [facility] savings coupon. Patient states she is out of town, [facility] [employer], [facility] [place], [place]. Rx sent. Patient aware to call if any concerns.   Routing to provider for final review. Patient is agreeable to disposition. Will close encounter.

## 2020-01-26 NOTE — Telephone Encounter (Signed)
Current ring will be in 4 wks as of 4/3.  OK to send Rx for 1 Nuvaring? Or remove ring and allow for menses, and place another after 7 days?

## 2020-01-26 NOTE — Telephone Encounter (Signed)
Per the manufacturor the ring is still protective for 4 weeks (up to date says even 5 weeks). I would recommend that she change the ring every 4 weeks

## 2020-04-05 ENCOUNTER — Encounter: Payer: Self-pay | Admitting: Family Medicine

## 2020-05-07 ENCOUNTER — Ambulatory Visit: Payer: BC Managed Care – PPO | Admitting: Family Medicine

## 2020-05-17 ENCOUNTER — Other Ambulatory Visit: Payer: Self-pay

## 2020-05-17 ENCOUNTER — Encounter: Payer: Self-pay | Admitting: Family Medicine

## 2020-05-17 ENCOUNTER — Ambulatory Visit: Payer: BC Managed Care – PPO | Admitting: Family Medicine

## 2020-05-17 VITALS — BP 119/80 | HR 74 | Temp 98.7°F | Ht 68.5 in | Wt 126.0 lb

## 2020-05-17 DIAGNOSIS — M2550 Pain in unspecified joint: Secondary | ICD-10-CM | POA: Diagnosis not present

## 2020-05-17 DIAGNOSIS — R0600 Dyspnea, unspecified: Secondary | ICD-10-CM

## 2020-05-17 DIAGNOSIS — K909 Intestinal malabsorption, unspecified: Secondary | ICD-10-CM

## 2020-05-17 DIAGNOSIS — H04123 Dry eye syndrome of bilateral lacrimal glands: Secondary | ICD-10-CM | POA: Insufficient documentation

## 2020-05-17 DIAGNOSIS — R197 Diarrhea, unspecified: Secondary | ICD-10-CM

## 2020-05-17 DIAGNOSIS — M791 Myalgia, unspecified site: Secondary | ICD-10-CM

## 2020-05-17 DIAGNOSIS — K580 Irritable bowel syndrome with diarrhea: Secondary | ICD-10-CM

## 2020-05-17 DIAGNOSIS — R079 Chest pain, unspecified: Secondary | ICD-10-CM

## 2020-05-17 DIAGNOSIS — R1084 Generalized abdominal pain: Secondary | ICD-10-CM

## 2020-05-17 LAB — COMPREHENSIVE METABOLIC PANEL
ALT: 22 U/L (ref 0–35)
AST: 19 U/L (ref 0–37)
Albumin: 4.3 g/dL (ref 3.5–5.2)
Alkaline Phosphatase: 33 U/L — ABNORMAL LOW (ref 39–117)
BUN: 14 mg/dL (ref 6–23)
CO2: 28 mEq/L (ref 19–32)
Calcium: 10 mg/dL (ref 8.4–10.5)
Chloride: 103 mEq/L (ref 96–112)
Creatinine, Ser: 0.77 mg/dL (ref 0.40–1.20)
GFR: 85.69 mL/min (ref >60.00)
Glucose, Bld: 90 mg/dL (ref 70–99)
Potassium: 4.4 mEq/L (ref 3.5–5.1)
Sodium: 137 mEq/L (ref 135–145)
Total Bilirubin: 0.5 mg/dL (ref 0.2–1.2)
Total Protein: 7.1 g/dL (ref 6.0–8.3)

## 2020-05-17 LAB — CBC WITH DIFFERENTIAL/PLATELET
Basophils Absolute: 0 10*3/uL (ref 0.0–0.1)
Basophils Relative: 0.8 % (ref 0.0–3.0)
Eosinophils Absolute: 0.2 10*3/uL (ref 0.0–0.7)
Eosinophils Relative: 4.5 % (ref 0.0–5.0)
HCT: 40.7 % (ref 36.0–46.0)
Hemoglobin: 13.6 g/dL (ref 12.0–15.0)
Lymphocytes Relative: 33.3 % (ref 12.0–46.0)
Lymphs Abs: 1.7 10*3/uL (ref 0.7–4.0)
MCHC: 33.5 g/dL (ref 30.0–36.0)
MCV: 92.7 fl (ref 78.0–100.0)
Monocytes Absolute: 0.3 10*3/uL (ref 0.1–1.0)
Monocytes Relative: 5.7 % (ref 3.0–12.0)
Neutro Abs: 2.9 10*3/uL (ref 1.4–7.7)
Neutrophils Relative %: 55.7 % (ref 43.0–77.0)
Platelets: 267 10*3/uL (ref 150.0–400.0)
RBC: 4.39 Mil/uL (ref 3.87–5.11)
RDW: 12.3 % (ref 11.5–15.5)
WBC: 5.2 10*3/uL (ref 4.0–10.5)

## 2020-05-17 LAB — SEDIMENTATION RATE: Sed Rate: 3 mm/hr (ref 0–20)

## 2020-05-17 LAB — IRON: Iron: 101 ug/dL (ref 42–145)

## 2020-05-17 LAB — VITAMIN D 25 HYDROXY (VIT D DEFICIENCY, FRACTURES): VITD: 26.75 ng/mL — ABNORMAL LOW (ref 30.00–100.00)

## 2020-05-17 LAB — T4, FREE: Free T4: 0.89 ng/dL (ref 0.60–1.60)

## 2020-05-17 LAB — C-REACTIVE PROTEIN: CRP: <1 mg/dL (ref 0.5–20.0)

## 2020-05-17 LAB — FERRITIN: Ferritin: 10.8 ng/mL (ref 10.0–291.0)

## 2020-05-17 LAB — TRANSFERRIN: Transferrin: 374 mg/dL — ABNORMAL HIGH (ref 212.0–360.0)

## 2020-05-17 LAB — TSH: TSH: 2.17 u[IU]/mL (ref 0.35–4.50)

## 2020-05-17 LAB — T3, FREE: T3, Free: 3.7 pg/mL (ref 2.3–4.2)

## 2020-05-17 LAB — VITAMIN B12: Vitamin B-12: 273 pg/mL (ref 211–911)

## 2020-05-17 MED ORDER — DICYCLOMINE HCL 10 MG PO CAPS: 10.0000 mg | ORAL_CAPSULE | Freq: Three times a day (TID) | ORAL | 0 refills | Status: DC

## 2020-05-17 NOTE — Patient Instructions (Addendum)
Once we receive labs I will call you and we will decide next step.  Start bentyl before meals and before bed for now.  Avoid dairy.  Hang in there. We will try to help you figure all this out.

## 2020-05-17 NOTE — Progress Notes (Signed)
   This visit occurred during the SARS-CoV-2 public health emergency.  Safety protocols were in place, including screening questions prior to the visit, additional usage of staff PPE, and extensive cleaning of exam room while observing appropriate contact time as indicated for disinfecting solutions.    Destiny Wade , 12/04/1985, 34 y.o., female MRN: [id number] Patient Care Team    Relationship Specialty Notifications Start End  Kuneff, Renee A, DO PCP - General Family Medicine  05/22/20   Pa, [facility]    05/22/20   Gherghe, Cristina, MD Consulting Physician Internal Medicine  05/22/20   Miller, Mary S, MD Consulting Physician Gynecology  05/22/20   Mann, Jyothi, MD Consulting Physician Gastroenterology  05/22/20     Chief Complaint  Patient presents with  . Dry Eye    started in teens last eye exam 2016  . Joint Pain    follwed by [facility] has MRI   . Diarrhea    daily from childhood last app with GI 2017 dx with IBS      Subjective: Pt presents for a transfer of care office visit with multiple complaints. Patient endorses having IBS-D, since she was a child.  She is established with GI last appointment in 2017.  She reports her stools are about 3-4 times a day.  She does not feel they are associated with any particular types of meals, but she does have urgency after meals.  Her stools can be oily or stringy and float.  She does not recall having stool studies completed in the past.  She did have a celiac panel which was negative in 2012.  She has attempted to be gluten-free which had no effect on her bowel habits.  She also followed the low FODMAP diet, which was also not helpful.  She has a history of reflux and has been on a PPI and Pepcid. She has a history of anxiety and depression and had been prescribed Luvox, which she states she stopped about 2 years ago.  She does endorse anxiety.  She states she will have "obsessive paranoid "thoughts surrounding her work  environment and her home life.  She gives examples of people at work looking at her, and they can be looking at her for no particular reason, but in her mind she is worried that they are talking about her.  Another example was her boyfriend being on social media and worried if he talks to female friends.  She is getting ready to start with a new counselor to help her cope. Patient has noticed a burning in her chest.  She states when she is running it "hurts to breathe.  "She will have discomfort on the left side of her chest.  She reports discomfort/pain does not radiate to her jaw or her arm.  She denies any cough during or after exercise.  She reports she did have 2 episodes of fainting as an adult.  She reports loss of consciousness 1 to 2 minutes during these events with no seizure-like activity reported. She also endorses joint pain and dry eyes.  She is under care of orthopedics for her knee pain. She has a family history of lupus in her maternal grandmother. She has had no past history of asthma.  She is not a smoker.  No flowsheet data found.  Allergies  Allergen Reactions  . Ceclor [Cefaclor]   . Penicillins     Unsure of reaction because was a baby  . Sulfa Antibiotics      Social History   Social History Narrative   Work or School: works at [employer] Situation: lives with roommate      Spiritual Beliefs: Christian      Lifestyle: regular CV exercise 3-4 times per week; good diet            Past Medical History:  Diagnosis Date  . Anxiety   . Depression    on antidepressants in the past, never SI or hospitalization  . IBS (irritable bowel syndrome)   . UTI (lower urinary tract infection)    Past Surgical History:  Procedure Laterality Date  . eye surgery  2006   . TYMPANOSTOMY TUBE PLACEMENT  Age 61   . WISDOM TOOTH EXTRACTION  2004    Family History  Adopted: Yes  Problem Relation Age of Onset  . Kidney disease Maternal Aunt   . Lupus Maternal  Grandmother   . Fibromyalgia Maternal Grandmother    Allergies as of 05/17/2020      Reactions   Ceclor [cefaclor]    Penicillins    Unsure of reaction because was a baby   Sulfa Antibiotics       Medication List       Accurate as of May 17, 2020 11:59 PM. If you have any questions, ask your nurse or doctor.        dicyclomine 10 MG capsule Commonly known as: Bentyl Take 1 capsule (10 mg total) by mouth 4 (four) times daily -  before meals and at bedtime. Started by: Howard Pouch, DO   etonogestrel-ethinyl estradiol 0.12-0.015 MG/24HR vaginal ring Commonly known as: NuvaRing Insert vaginally and leave in place for 3 consecutive weeks,and insert new Nuvaring for continuous use What changed: Another medication with the same name was removed. Continue taking this medication, and follow the directions you see here. Changed by: Howard Pouch, DO   fluticasone 50 MCG/ACT nasal spray Commonly known as: FLONASE Place 2 sprays into both nostrils daily.       All past medical history, surgical history, allergies, family history, immunizations andmedications were updated in the EMR today and reviewed under the history and medication portions of their EMR.     ROS: Negative, with the exception of above mentioned in HPI   Objective:  BP 119/80   Pulse 74   Temp 98.7 F (37.1 C) (Temporal)   Ht 5' 8.5" (1.74 m)   Wt 126 lb (57.2 kg)   SpO2 100%   BMI 18.88 kg/m  Body mass index is 18.88 kg/m. Gen: Afebrile. No acute distress. Nontoxic in appearance, well developed, well nourished.  Pleasant, thin female. HENT: AT. [place]. MMM, no oral lesions. Eyes:Pupils Equal Round Reactive to light, Extraocular movements intact,  Conjunctiva without redness, discharge or icterus. ?  Mild right upper eyelid lag Neck/lymp/endocrine: Supple, no lymphadenopathy, no thyromegaly CV: RRR no murmur, no edema Chest: CTAB, no wheeze or crackles. Good air movement, normal resp effort.  Abd: Soft.   Flat. NTND. BS present.  No masses palpated. No rebound or guarding.  Skin: No rashes, purpura or petechiae.  Neuro:  Normal gait. PERLA. EOMi. Alert. Oriented x3  Psych: Normal affect, dress and demeanor. Normal speech. Normal thought content and judgment.  No exam data present No results found. No results found for this or any previous visit (from the past 24 hour(s)).  Assessment/Plan: Destiny Wade is a 34 y.o. female present for OV for  Diarrhea, unspecified type/Irritable bowel syndrome with diarrhea/Generalized  abdominal pain Rule out IBS versus IBD with inflammatory markers and autoimmune panel.   - Prior celiac panel was negative. For now avoid dairy products. Trial of Bentyl premeals and before bed. Consider trial of amitriptyline nightly - ANA, IFA Comprehensive Panel-([facility]) - C-reactive protein -ESR - TSH - T4, free - Comp Met (CMET) - T3, free Dependent upon results consider stool studies  Dry eyes/Polyarthralgia/myalgia Possibly multifactorial versus autoimmune causes versus vitamin deficiencies.  She has a family history of lupus in her MGM - ANA, IFA Comprehensive Panel-([facility]) - Cyclic citrul peptide antibody, IgG ([facility]) - Rheumatoid Factor - Sedimentation rate - C-reactive protein - TSH - T4, free - Comp Met (CMET) - T3, free - CBC w/Diff - Iron - Ferritin - Transferrin  Dyspnea, unspecified type/Chest pain, unspecified type Consider  exercise-induced asthma as possible cause. - ANA, IFA Comprehensive Panel-([facility]) - CBC w/Diff - Iron - Ferritin - Transferrin If lab results do not indicate possible cause-on follow-up consider chest x-ray and albuterol inhaler trial preexercise.  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  Discussed self-management of  symptoms.  Outlined signs and symptoms indicating need for more acute intervention.  Patient verbalized understanding and all questions were answered.  Patient received an After-Visit Summary.   > 45 Minutes was dedicated to this patient's encounter to include pre-visit review of chart, face-to-face time with patient and post-visit work- which include documentation and prescribing medications and/or ordering test when necessary.     Orders Placed This Encounter  Procedures  . ANA, IFA Comprehensive Panel-([facility])  . Cyclic citrul peptide antibody, IgG ([facility])  . Rheumatoid Factor  . Sedimentation rate  . C-reactive protein  . TSH  . T4, free  . Comp Met (CMET)  . T3, free  . CBC w/Diff  . Iron  . Ferritin  . Transferrin  . Vitamin D (25 hydroxy)  . B12  . Anti-nuclear ab-titer (ANA titer)   Meds ordered this encounter  Medications  . dicyclomine (BENTYL) 10 MG capsule    Sig: Take 1 capsule (10 mg total) by mouth 4 (four) times daily -  before meals and at bedtime.    Dispense:  120 capsule    Refill:  0   Referral Orders  No referral(s) requested today     Note is dictated utilizing voice recognition software. Although note has been proof read prior to signing, occasional typographical errors still can be missed. If any questions arise, please do not hesitate to call for verification.   electronically signed by:  Howard Pouch, DO  [place]

## 2020-05-22 ENCOUNTER — Telehealth: Payer: Self-pay | Admitting: Family Medicine

## 2020-05-22 ENCOUNTER — Encounter: Payer: Self-pay | Admitting: Family Medicine

## 2020-05-22 DIAGNOSIS — R079 Chest pain, unspecified: Secondary | ICD-10-CM

## 2020-05-22 DIAGNOSIS — R0600 Dyspnea, unspecified: Secondary | ICD-10-CM

## 2020-05-22 DIAGNOSIS — M791 Myalgia, unspecified site: Secondary | ICD-10-CM | POA: Insufficient documentation

## 2020-05-22 DIAGNOSIS — K580 Irritable bowel syndrome with diarrhea: Secondary | ICD-10-CM | POA: Insufficient documentation

## 2020-05-22 LAB — ANA, IFA COMPREHENSIVE PANEL
Anti Nuclear Antibody (ANA): POSITIVE — AB
ENA SM Ab Ser-aCnc: <1 AI
SM/RNP: <1 AI
SSA (Ro) (ENA) Antibody, IgG: <1 AI
SSB (La) (ENA) Antibody, IgG: <1 AI
Scleroderma (Scl-70) (ENA) Antibody, IgG: <1 AI
ds DNA Ab: 3 IU/mL

## 2020-05-22 LAB — CYCLIC CITRUL PEPTIDE ANTIBODY, IGG: Cyclic Citrullin Peptide Ab: <16 UNITS

## 2020-05-22 LAB — RHEUMATOID FACTOR: Rheumatoid fact SerPl-aCnc: <14 IU/mL (ref <14)

## 2020-05-22 LAB — ANTI-NUCLEAR AB-TITER (ANA TITER): ANA Titer 1: 1:40 {titer} — ABNORMAL HIGH

## 2020-05-22 MED ORDER — VITAMIN D (ERGOCALCIFEROL) 1.25 MG (50000 UNIT) PO CAPS: 50000.0000 [IU] | ORAL_CAPSULE | ORAL | 0 refills | Status: DC

## 2020-05-22 NOTE — Telephone Encounter (Signed)
Advised patient of lab results and provider recommendations.  I also sent her the recommendations via MyChart per patient request.  Patient would like to do sublingual B12 to start and she scheduled appt 06/04/20 just a day shy of 2 weeks before school starts back.

## 2020-05-22 NOTE — Telephone Encounter (Signed)
Please inform patient the following information: We have finally received all of her lab results back. -Liver, kidney and thyroid function are normal -Blood cell counts and electrolytes are normal -Inflammatory markers are negative. -Iron levels are normal -Vitamin D is lower than desired at 26.  Normal levels are above 30, and ideally levels between 40-50 or desired.  -B12 is extremely low at 273.  Normal B12 levels are greater than 500, and ideally greater than 750 desired. ANA titer is positive, however it is weakly positive and the reflex testings are all negative.  We will discuss in more detail at her follow-up.  Recommendations: Start vitamin D supplementation.  I have called in a high-dose once weekly vitamin D supplementation, would encourage her to take this on the same day weekly with a meal.  Please check we have the correct pharmacy for her. With B12 levels as low as she has and her GI history, she will likely need B12 injections in order to be able to properly absorb B12.  Her choices are purchasing over-the-counter sublingual solution of at least 2000 mcg daily versus starting B12 injections by nurse visit.    -If desiring B12 injections please set her up for B12 IM injection every 2 weeks x6. --We will need to retest both of these levels in around 10-12 weeks with provider appointment.  Lastly, she is to start the Bentyl as we discussed and follow-up in 2-3 weeks with this provider for further evaluation on her complaints and review of all lab results.  I would like her to have a chest x-ray 2-3 days prior to this follow-up with provider appointment.  I have placed the order for chest x-ray at [facility] imaging on [address].     -Please provide her instructions on scheduling her chest x-ray.

## 2020-05-28 ENCOUNTER — Ambulatory Visit
Admission: RE | Admit: 2020-05-28 | Discharge: 2020-05-28 | Disposition: A | Discharge status: Home / Self Care | Payer: BC Managed Care – PPO | Source: Ambulatory Visit | Attending: Family Medicine | Admitting: Family Medicine

## 2020-05-28 ENCOUNTER — Telehealth: Payer: Self-pay | Admitting: Family Medicine

## 2020-05-28 DIAGNOSIS — R0600 Dyspnea, unspecified: Secondary | ICD-10-CM

## 2020-05-28 DIAGNOSIS — R079 Chest pain, unspecified: Secondary | ICD-10-CM

## 2020-05-28 NOTE — Telephone Encounter (Signed)
Sent Dr Claiborne Billings phone note asking if okay to get x-rays done earlier.  Awaiting response.

## 2020-05-28 NOTE — Telephone Encounter (Signed)
My Chart message sent

## 2020-05-28 NOTE — Telephone Encounter (Signed)
The order is in- she can get at her convenience. Results will be discussed at her followup on 06/04/2020  Thanks .

## 2020-05-28 NOTE — Telephone Encounter (Signed)
Patient responded to MyChart message about recommendations from Dr Claiborne Billings about needing to get chest x-ray 2-3 days prior.   Patient would like to get them done earlier due to being out of town.  Please advise if this is okay?   Thanks.

## 2020-06-04 ENCOUNTER — Ambulatory Visit: Payer: BC Managed Care – PPO | Admitting: Family Medicine

## 2020-06-09 ENCOUNTER — Other Ambulatory Visit: Payer: Self-pay | Admitting: Family Medicine

## 2020-06-11 ENCOUNTER — Encounter: Payer: Self-pay | Admitting: Family Medicine

## 2020-06-11 ENCOUNTER — Other Ambulatory Visit: Payer: Self-pay

## 2020-06-11 ENCOUNTER — Ambulatory Visit: Payer: BC Managed Care – PPO | Admitting: Family Medicine

## 2020-06-11 VITALS — BP 117/74 | HR 82 | Temp 98.2°F | Ht 68.5 in | Wt 127.6 lb

## 2020-06-11 DIAGNOSIS — K580 Irritable bowel syndrome with diarrhea: Secondary | ICD-10-CM

## 2020-06-11 DIAGNOSIS — M2550 Pain in unspecified joint: Secondary | ICD-10-CM | POA: Diagnosis not present

## 2020-06-11 DIAGNOSIS — E559 Vitamin D deficiency, unspecified: Secondary | ICD-10-CM

## 2020-06-11 DIAGNOSIS — R197 Diarrhea, unspecified: Secondary | ICD-10-CM | POA: Diagnosis not present

## 2020-06-11 DIAGNOSIS — K909 Intestinal malabsorption, unspecified: Secondary | ICD-10-CM | POA: Diagnosis not present

## 2020-06-11 DIAGNOSIS — E538 Deficiency of other specified B group vitamins: Secondary | ICD-10-CM

## 2020-06-11 DIAGNOSIS — M791 Myalgia, unspecified site: Secondary | ICD-10-CM

## 2020-06-11 DIAGNOSIS — F418 Other specified anxiety disorders: Secondary | ICD-10-CM

## 2020-06-11 MED ORDER — DICYCLOMINE HCL 10 MG PO CAPS: 10.0000 mg | ORAL_CAPSULE | Freq: Three times a day (TID) | ORAL | 6 refills | Status: DC

## 2020-06-11 MED ORDER — VENLAFAXINE HCL ER 37.5 MG PO CP24
37.5000 mg | ORAL_CAPSULE | Freq: Every day | ORAL | 0 refills | Status: DC
Start: 2020-06-11 — End: 2020-06-22

## 2020-06-11 MED ORDER — VENLAFAXINE HCL ER 75 MG PO CP24: 75.0000 mg | ORAL_CAPSULE | Freq: Every day | ORAL | 1 refills | Status: DC

## 2020-06-11 NOTE — Progress Notes (Signed)
This visit occurred during the SARS-CoV-2 public health emergency.  Safety protocols were in place, including screening questions prior to the visit, additional usage of staff PPE, and extensive cleaning of exam room while observing appropriate contact time as indicated for disinfecting solutions.    Destiny Wade , 11-Nov-1985, 34 y.o., female MRN: [id number] Patient Care Team    Relationship Specialty Notifications Start End  Ma Hillock, DO PCP - General Family Medicine  05/22/20   Pa, [facility]    05/22/20   Philemon Kingdom, MD Consulting Physician Internal Medicine  05/22/20   Megan Salon, MD Consulting Physician Gynecology  05/22/20   Juanita Craver, MD Consulting Physician Gastroenterology  05/22/20     Chief Complaint  Patient presents with  . Follow-up    2 week follow up labs     Subjective: Pt presents for follow up chronic diarrhea and myalgias. Patient reports she has started the B12 and vitamin D supplementations.  On lab last appointment she was found to be mildly vitamin D deficient with a vitamin D of 26 and a B12 of 273.  She has started Bentyl and feels it has been helping with her chronic diarrhea/IBS but her diarrhea is still present.  She is concerned about her anxiety and its effect it is having on her relationships.  She would like to restart an antianxiety medication.  Patient had positive "nuclear speckled ANA with low titers of 1: 40.  CCP, double-stranded DNA, rheumatoid factor SM antibodies, SSA and SSB negative, scleroderma.  Chest x-ray normal.  Thyroid panel normal.  CBC and CMP normal.  Iron and ferritin normal. Prior note:  Patient endorses having IBS-D, since she was a child.  She is established with GI last appointment in 2017.  She reports her stools are about 3-4 times a day.  She does not feel they are associated with any particular types of meals, but she does have urgency after meals.  Her stools can be oily or stringy and float.  She  does not recall having stool studies completed in the past.  She did have a celiac panel which was negative in 2012.  She has attempted to be gluten-free which had no effect on her bowel habits.  She also followed the low FODMAP diet, which was also not helpful.  She has a history of reflux and has been on a PPI and Pepcid. She has a history of anxiety and depression and had been prescribed Luvox, which she states she stopped about 2 years ago.  She does endorse anxiety.  She states she will have "obsessive paranoid "thoughts surrounding her work environment and her home life.  She gives examples of people at work looking at her, and they can be looking at her for no particular reason, but in her mind she is worried that they are talking about her.  Another example was her boyfriend being on social media and worried if he talks to female friends.  She is getting ready to start with a new counselor to help her cope. Patient has noticed a burning in her chest.  She states when she is running it "hurts to breathe.  "She will have discomfort on the left side of her chest.  She reports discomfort/pain does not radiate to her jaw or her arm.  She denies any cough during or after exercise.  She reports she did have 2 episodes of fainting as an adult.  She reports loss of consciousness  1 to 2 minutes during these events with no seizure-like activity reported. She also endorses joint pain and dry eyes.  She is under care of orthopedics for her knee pain. She has a family history of lupus in her maternal grandmother. She has had no past history of asthma.  She is not a smoker.  Depression screen [facility] 2/9 06/11/2020  Decreased Interest 3  Down, Depressed, Hopeless 3  PHQ - 2 Score 6  Altered sleeping 1  Tired, decreased energy 1  Change in appetite 1  Feeling bad or failure about yourself  1  Trouble concentrating 3  Moving slowly or fidgety/restless 0  Suicidal thoughts 0  PHQ-9 Score 13    Allergies    Allergen Reactions  . Ceclor [Cefaclor]   . Penicillins     Unsure of reaction because was a baby  . Sulfa Antibiotics    Social History   Social History Narrative   Work or School: works at [employer] Situation: lives with roommate      Spiritual Beliefs: Christian      Lifestyle: regular CV exercise 3-4 times per week; good diet            Past Medical History:  Diagnosis Date  . Anxiety   . Depression    on antidepressants in the past, never SI or hospitalization  . IBS (irritable bowel syndrome)   . UTI (lower urinary tract infection)    Past Surgical History:  Procedure Laterality Date  . eye surgery  2006   . TYMPANOSTOMY TUBE PLACEMENT  Age 52   . WISDOM TOOTH EXTRACTION  2004    Family History  Adopted: Yes  Problem Relation Age of Onset  . Kidney disease Maternal Aunt   . Lupus Maternal Grandmother   . Fibromyalgia Maternal Grandmother    Allergies as of 06/11/2020      Reactions   Ceclor [cefaclor]    Penicillins    Unsure of reaction because was a baby   Sulfa Antibiotics       Medication List       Accurate as of June 11, 2020 11:59 PM. If you have any questions, ask your nurse or doctor.        B-12 2000 MCG Tabs Place 2,000 mcg under the tongue daily. B12 sublingual solution   dicyclomine 10 MG capsule Commonly known as: BENTYL Take 1 capsule (10 mg total) by mouth 4 (four) times daily -  before meals and at bedtime.   etonogestrel-ethinyl estradiol 0.12-0.015 MG/24HR vaginal ring Commonly known as: NuvaRing Insert vaginally and leave in place for 3 consecutive weeks,and insert new Nuvaring for continuous use   fluticasone 50 MCG/ACT nasal spray Commonly known as: FLONASE Place 2 sprays into both nostrils daily.   venlafaxine XR 37.5 MG 24 hr capsule Commonly known as: Effexor XR Take 1 capsule (37.5 mg total) by mouth daily with breakfast. Started by: Howard Pouch, DO   venlafaxine XR 75 MG 24 hr  capsule Commonly known as: Effexor XR Take 1 capsule (75 mg total) by mouth daily with breakfast. Started by: Howard Pouch, DO   Vitamin D (Ergocalciferol) 1.25 MG (50000 UNIT) Caps capsule Commonly known as: DRISDOL Take 1 capsule (50,000 Units total) by mouth every 7 (seven) days.       All past medical history, surgical history, allergies, family history, immunizations andmedications were updated in the EMR today and reviewed under the history and medication portions of  their EMR.     ROS: Negative, with the exception of above mentioned in HPI   Objective:  BP 117/74   Pulse 82   Temp 98.2 F (36.8 C)   Ht 5' 8.5" (1.74 m)   Wt 127 lb 9.6 oz (57.9 kg)   SpO2 100%   BMI 19.12 kg/m  Body mass index is 19.12 kg/m. Gen: Afebrile. No acute distress.  Nontoxic in presentation.  Pleasant Caucasian female. HENT: AT. [place].  Eyes:Pupils Equal Round Reactive to light, Extraocular movements intact,  Conjunctiva without redness, discharge or icterus. Neck/lymp/endocrine: Supple, no lymphadenopathy, no thyromegaly CV: RRR no murmur, no edema, +2/4 P posterior tibialis pulses Chest: CTAB, no wheeze or crackles Abd: Soft.  Flat. NTND. BS present.  No masses palpated.  Skin: No rashes, purpura or petechiae.  Neuro:  Normal gait. PERLA. EOMi. Alert. Oriented x3  Psych: Normal affect, dress and demeanor. Normal speech. Normal thought content.  Admits to mildly obsessive traits with worry surrounding her relationship and family history of lupus.  No exam data present No results found. No results found for this or any previous visit (from the past 24 hour(s)).  Assessment/Plan: Dreya Buhrman is a 34 y.o. female present for OV for  Diarrhea, unspecified type/Irritable bowel syndrome with diarrhea -Patient is seeing some improvement with Bentyl use. -Could consider trial of amitriptyline at a later date if necessary.  For now we will focus on her significant anxiety with SNRI. - Prior  celiac panel was negative. For now avoid dairy products. - ANA, IFA Comprehensive Panel-([facility])>  positive, titer is 1: 40, negative comprehensive panel reflex.  Discussed results with patient today a titer of 1:40.  Results explained to patient that despite "positive "ANA , a titer less than 1:160 and a negative reflex panel makes SLE diagnosis very unlikely. - C-reactive protein -ESR, TSH, T4, T3 and CMP >normal stool studies; fecal fat, Giardia and fecal WBC ordered today.  Patient was provided with instructions and collection kit.  Polyarthralgia/myalgia Discuss test results with her in detail. She has a family history of lupus in her MGM which has her concern.  Her ANA was positive with a very weak titer of 1: 40 and a negative reflex panel.  It was explained to patient SLE is very unlikely with a negative reflex panel and a titer less than 1:160. Laboratory markers negative.  Thyroid panel normal.  Iron and CMP normal. Fibromyalgia is more likely the cause of her muscle skeletal complaints.  Especially given she has IBS syndrome as well.  Dyspnea, unspecified type/Chest pain, unspecified type Chest x-ray was normal. CBC and iron panel normal. Could consider albuterol preexercise if symptoms recur.  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  She is supplementing with high-dose vitamin D currently. - B12 low> he is supplementing with sublingual B12 solution. Labs to recheck levels in approximately 4-8 weeks > orders placed can be lab appointment only if not needing follow-up on Effexor dose.    Depression with anxiety: Patient has rather significant depression with anxiety.  Discussed different possible medications to help her with her depression anxiety/musculoskeletal complaints and her obsessive thoughts. Start Effexor taper to 75 mg daily. Follow-up 5.5 months, sooner if  needed.   Reviewed expectations re: course of current medical issues.  Discussed self-management of symptoms.  Outlined signs and symptoms indicating need for more acute intervention.  Patient verbalized understanding and all questions  were answered.  Patient received an After-Visit Summary.   Orders Placed This Encounter  Procedures  . Fecal Fat, Qualitative  . Ova and parasite examination  . Vitamin D (25 hydroxy)  . B12  . Stool, WBC/Lactoferrin  . Vitamin D (25 hydroxy)  . B12   Meds ordered this encounter  Medications  . venlafaxine XR (EFFEXOR XR) 37.5 MG 24 hr capsule    Sig: Take 1 capsule (37.5 mg total) by mouth daily with breakfast.    Dispense:  7 capsule    Refill:  0  . venlafaxine XR (EFFEXOR XR) 75 MG 24 hr capsule    Sig: Take 1 capsule (75 mg total) by mouth daily with breakfast.    Dispense:  90 capsule    Refill:  1  . dicyclomine (BENTYL) 10 MG capsule    Sig: Take 1 capsule (10 mg total) by mouth 4 (four) times daily -  before meals and at bedtime.    Dispense:  120 capsule    Refill:  6   Referral Orders  No referral(s) requested today     Note is dictated utilizing voice recognition software. Although note has been proof read prior to signing, occasional typographical errors still can be missed. If any questions arise, please do not hesitate to call for verification.   electronically signed by:  Howard Pouch, DO  [place]

## 2020-06-11 NOTE — Patient Instructions (Addendum)
effexor 37.5 mg > start 1 tab daily for 7 days. 2nd script starts day 8 and is also 1 tab in the morning but it is 75 mg.   Follow up in 5.5 mos if feeling well - sooner if needed.   Vit d and b12 can be lab appt only at end of October. Or can be provider appt if needed on effexor followup.

## 2020-06-14 ENCOUNTER — Encounter: Payer: Self-pay | Admitting: Family Medicine

## 2020-06-14 DIAGNOSIS — F418 Other specified anxiety disorders: Secondary | ICD-10-CM | POA: Insufficient documentation

## 2020-06-21 ENCOUNTER — Telehealth: Payer: Self-pay

## 2020-06-21 NOTE — Telephone Encounter (Signed)
Message retrieved from voicemail 8/26 3:35PM  Please call regarding med reaction with the anti depressant medication.  Patient can be reached at [phone]

## 2020-06-21 NOTE — Telephone Encounter (Signed)
Spoke with patient regarding symptoms.  Patient reports starting Effexor 37.5 mg daily on 06/12/20.  On Day 3 of medication, she reports continued SOB, throat tightening and chest pressure.  Patient states she does not feel that this was an anxiety or panic attack since the symptoms persisted all day.  Patient stopped medication, last dose 06/15/20.  She still desires a medication for depression w/anxiety.

## 2020-06-22 MED ORDER — FLUVOXAMINE MALEATE 100 MG PO TABS: ORAL_TABLET | ORAL | 1 refills | Status: DC

## 2020-06-22 NOTE — Telephone Encounter (Signed)
In hopes to prevent further unknowns or reaction will try to maximize the medication she has had in the past with out side effects.  Called in Luvox taper. Start 1/2 tab  Luvox 1 hour before bed for 7 days,then increase to 1 tab nightly. Follow-up in 6 weeks.  Sooner if needed.  Please call pharmacy and discontinue all Effexor scripts. Thanks.

## 2020-06-22 NOTE — Telephone Encounter (Signed)
LM requesting call back.   Pharmacy called, Effexor scripts discontinued.

## 2020-06-22 NOTE — Telephone Encounter (Signed)
Spoke with patient, advised of medication change/instructions.  F/U with PCP scheduled for 6 weeks.

## 2020-07-27 ENCOUNTER — Other Ambulatory Visit: Payer: Self-pay

## 2020-07-27 ENCOUNTER — Encounter: Payer: Self-pay | Admitting: Family Medicine

## 2020-07-27 ENCOUNTER — Ambulatory Visit (INDEPENDENT_AMBULATORY_CARE_PROVIDER_SITE_OTHER): Payer: BC Managed Care – PPO | Admitting: Family Medicine

## 2020-07-27 VITALS — BP 104/65 | HR 90 | Temp 98.7°F | Ht 68.5 in | Wt 126.0 lb

## 2020-07-27 DIAGNOSIS — K580 Irritable bowel syndrome with diarrhea: Secondary | ICD-10-CM

## 2020-07-27 DIAGNOSIS — E538 Deficiency of other specified B group vitamins: Secondary | ICD-10-CM | POA: Diagnosis not present

## 2020-07-27 DIAGNOSIS — Z23 Encounter for immunization: Secondary | ICD-10-CM

## 2020-07-27 DIAGNOSIS — F418 Other specified anxiety disorders: Secondary | ICD-10-CM | POA: Diagnosis not present

## 2020-07-27 DIAGNOSIS — E559 Vitamin D deficiency, unspecified: Secondary | ICD-10-CM | POA: Diagnosis not present

## 2020-07-27 DIAGNOSIS — F605 Obsessive-compulsive personality disorder: Secondary | ICD-10-CM

## 2020-07-27 DIAGNOSIS — R0600 Dyspnea, unspecified: Secondary | ICD-10-CM

## 2020-07-27 DIAGNOSIS — R0789 Other chest pain: Secondary | ICD-10-CM

## 2020-07-27 MED ORDER — PAROXETINE HCL 20 MG PO TABS: 20.0000 mg | ORAL_TABLET | Freq: Every day | ORAL | 2 refills | Status: DC

## 2020-07-27 NOTE — Patient Instructions (Signed)
[facility].  Address: [address], [place], [place] [address]   Start paxil 20 mg daily. I called in a 30 day supply with refills.   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

## 2020-07-27 NOTE — Progress Notes (Signed)
This visit occurred during the SARS-CoV-2 public health emergency.  Safety protocols were in place, including screening questions prior to the visit, additional usage of staff PPE, and extensive cleaning of exam room while observing appropriate contact time as indicated for disinfecting solutions.    Destiny Wade , [birth year], 34 y.o., female MRN: [id number] Patient Care Team    Relationship Specialty Notifications Start End  Ma Hillock, DO PCP - General Family Medicine  05/22/20   Pa, [facility]    05/22/20   Philemon Kingdom, MD Consulting Physician Internal Medicine  05/22/20   Megan Salon, MD Consulting Physician Gynecology  05/22/20   Juanita Craver, MD Consulting Physician Gastroenterology  05/22/20     Chief Complaint  Patient presents with  . Follow-up    CMC;     Subjective: Pt presents for follow up  Patient reports today the Luvox is causing her rather significant morning nausea.  She does not think she be able to divide a dose secondary to sedation and nausea.  She was unable to tolerate Effexor.  She still would like to try medication to help with her anxiety and obsessive compulsions.  She has not completed the stool studies for her diarrhea work-up.  She has taken the vitamin D as prescribed and is due for retesting.  She is increasingly worried about her chest pain.  She states she now has chest pain with running and she is concerned it is heart.   Prior note Patient reports she has started the B12 and vitamin D supplementations.  On lab last appointment she was found to be mildly vitamin D deficient with a vitamin D of 26 and a B12 of 273.  She has started Bentyl and feels it has been helping with her chronic diarrhea/IBS but her diarrhea is still present.  She is concerned about her anxiety and its effect it is having on her relationships.  She would like to restart an antianxiety medication.  Patient had positive "nuclear speckled ANA with low titers  of 1: 40.  CCP, double-stranded DNA, rheumatoid factor SM antibodies, SSA and SSB negative, scleroderma.  Chest x-ray normal.  Thyroid panel normal.  CBC and CMP normal.  Iron and ferritin normal. Prior note:  Patient endorses having IBS-D, since she was a child.  She is established with GI last appointment in 2017.  She reports her stools are about 3-4 times a day.  She does not feel they are associated with any particular types of meals, but she does have urgency after meals.  Her stools can be oily or stringy and float.  She does not recall having stool studies completed in the past.  She did have a celiac panel which was negative in 2012.  She has attempted to be gluten-free which had no effect on her bowel habits.  She also followed the low FODMAP diet, which was also not helpful.  She has a history of reflux and has been on a PPI and Pepcid. She has a history of anxiety and depression and had been prescribed Luvox, which she states she stopped about 2 years ago.  She does endorse anxiety.  She states she will have "obsessive paranoid "thoughts surrounding her work environment and her home life.  She gives examples of people at work looking at her, and they can be looking at her for no particular reason, but in her mind she is worried that they are talking about her.  Another example was  her boyfriend being on social media and worried if he talks to female friends.  She is getting ready to start with a new counselor to help her cope. Patient has noticed a burning in her chest.  She states when she is running it "hurts to breathe.  "She will have discomfort on the left side of her chest.  She reports discomfort/pain does not radiate to her jaw or her arm.  She denies any cough during or after exercise.  She reports she did have 2 episodes of fainting as an adult.  She reports loss of consciousness 1 to 2 minutes during these events with no seizure-like activity reported. She also endorses joint pain and dry  eyes.  She is under care of orthopedics for her knee pain. She has a family history of lupus in her maternal grandmother. She has had no past history of asthma.  She is not a smoker.  Depression screen [facility] 2/9 07/27/2020 06/11/2020  Decreased Interest 2 3  Down, Depressed, Hopeless 3 3  PHQ - 2 Score 5 6  Altered sleeping 2 1  Tired, decreased energy 3 1  Change in appetite 1 1  Feeling bad or failure about yourself  2 1  Trouble concentrating 2 3  Moving slowly or fidgety/restless 0 0  Suicidal thoughts 0 0  PHQ-9 Score 15 13    Allergies  Allergen Reactions  . Ceclor [Cefaclor]   . Effexor [Venlafaxine]     Chest tightness and shortness of breath  . Penicillins     Unsure of reaction because was a baby  . Sulfa Antibiotics    Social History   Social History Narrative   Work or School: works at [employer] Situation: lives with roommate      Spiritual Beliefs: Christian      Lifestyle: regular CV exercise 3-4 times per week; good diet            Past Medical History:  Diagnosis Date  . Anxiety   . Depression    on antidepressants in the past, never SI or hospitalization  . IBS (irritable bowel syndrome)   . UTI (lower urinary tract infection)    Past Surgical History:  Procedure Laterality Date  . eye surgery  2006   . TYMPANOSTOMY TUBE PLACEMENT  Age 89   . WISDOM TOOTH EXTRACTION  2004    Family History  Adopted: Yes  Problem Relation Age of Onset  . Kidney disease Maternal Aunt   . Lupus Maternal Grandmother   . Fibromyalgia Maternal Grandmother    Allergies as of 07/27/2020      Reactions   Ceclor [cefaclor]    Effexor [venlafaxine]    Chest tightness and shortness of breath   Penicillins    Unsure of reaction because was a baby   Sulfa Antibiotics       Medication List       Accurate as of July 27, 2020  4:17 PM. If you have any questions, ask your nurse or doctor.        B-12 2000 MCG Tabs Place 2,000 mcg under the  tongue daily. B12 sublingual solution   dicyclomine 10 MG capsule Commonly known as: BENTYL Take 1 capsule (10 mg total) by mouth 4 (four) times daily -  before meals and at bedtime.   etonogestrel-ethinyl estradiol 0.12-0.015 MG/24HR vaginal ring Commonly known as: NuvaRing Insert vaginally and leave in place for 3 consecutive weeks,and insert new Nuvaring  for continuous use   fluticasone 50 MCG/ACT nasal spray Commonly known as: FLONASE Place 2 sprays into both nostrils daily.   fluvoxaMINE 100 MG tablet Commonly known as: LUVOX 0.5 tab (1/2 tab) QHS for 7 days then, 1 tab QHS.   Vitamin D (Ergocalciferol) 1.25 MG (50000 UNIT) Caps capsule Commonly known as: DRISDOL Take 1 capsule (50,000 Units total) by mouth every 7 (seven) days.       All past medical history, surgical history, allergies, family history, immunizations andmedications were updated in the EMR today and reviewed under the history and medication portions of their EMR.     ROS: Negative, with the exception of above mentioned in HPI   Objective:  BP 104/65   Pulse 90   Temp 98.7 F (37.1 C) (Oral)   Ht 5' 8.5" (1.74 m)   Wt 126 lb (57.2 kg)   SpO2 96%   BMI 18.88 kg/m  Body mass index is 18.88 kg/m.  Gen: Afebrile. No acute distress.  HENT: AT. .  Eyes:Pupils Equal Round Reactive to light, Extraocular movements intact,  Conjunctiva without redness, discharge or icterus. CV: RRR no murmur, no edema Chest: CTAB, no wheeze or crackles Skin: no rashes, purpura or petechiae.  Neuro:  Normal gait. PERLA. EOMi. Alert. Oriented x3  Psych: Significantly anxious otherwise normal affect, dress and demeanor. Normal speech. Normal thought content and judgment with episodes of obsessive-compulsive tendencies.   No exam data present No results found. No results found for this or any previous visit (from the past 24 hour(s)).  Assessment/Plan: Wynette Jersey is a 34 y.o. female present for OV for    Diarrhea, unspecified type/Irritable bowel syndrome with diarrhea -Patient is seeing some improvement with Bentyl use. -Could consider trial of amitriptyline at a later date if necessary.  For now we will focus on her significant anxiety with SNRI. - Prior celiac panel was negative. For now avoid dairy products. - ANA, IFA Comprehensive Panel-([facility])>  positive, titer is 1: 40, negative comprehensive panel reflex.  Discussed results with patient today a titer of 1:40.  Results explained to patient that despite "positive "ANA , a titer less than 1:160 and a negative reflex panel makes SLE diagnosis very unlikely. - C-reactive protein normal -ESR, TSH, T4, T3 and CMP >normal stool studies; fecal fat, Giardia and fecal WBC ordered > she did not provide stool sample.  >  We will see if we can arrange to have her complete these and drop off to [facility] location which is closer to an easier for her.  Polyarthralgia/myalgia Discuss test results with her in detail. She has a family history of lupus in her MGM which has her concern.  Her ANA was positive with a very weak titer of 1: 40 and a negative reflex panel.  It was explained to patient SLE is very unlikely with a negative reflex panel and a titer less than 1:160. Laboratory markers negative.  Thyroid panel normal.  Iron and CMP normal. Fibromyalgia is more likely the cause of her muscle skeletal complaints.  Especially given she has IBS syndrome as well. This is frustrating for patient and she desires a confirmatory diagnosis for her symptoms.  It was explained to her in great detail that fibromyalgia or chronic myofascial pain syndrome does not have a "positive "result to identify.  It is a diagnosis based on clinical suspicion and negative work-up for other leading causes of symptoms.  Dyspnea, unspecified type/Chest pain, unspecified type Chest x-ray was normal. CBC and  iron panel normal. Referral to pulmonology for exercise-induced/asthma  work-up Referral to cardiology for patient's chest pain complaints with exertion.  Intestinal malabsorption, unspecified type/vitamin D deficiency Malabsorption from diarrhea may be playing a role in her polyarthralgia/myalgia - Vitamin D > mildly low.  She is supplementing with high-dose vitamin D currently> recheck levels ordered today> unable to obtain labs today with 2 unsuccessful attempts by labs.  Patient will be called back to schedule at another location that is more easily assessable to her.  She was encouraged to hydrate well before lab collections. - B12 low> he is supplementing with sublingual B12 solution.  Depression with anxiety: Patient has rather significant depression with anxiety.  Discussed different possible medications to help her with her depression anxiety/musculoskeletal complaints and her obsessive thoughts. Attempted to start Effexor, which patient reports chest pain symptoms with 1 dose.  Therefore it was discontinued.  Possibly related to Effexor versus her anxiety. Since unable to tolerate Effexor, will return to the medication we know she tolerated-Luvox, which now she states causes too much morning nausea and she cannot tolerate it and it reportedly is not helpful.  Since we cannot increase the dose given her side effects we will need to try a different agent. Paxil 20 mg daily.  If Paxil is not tolerated or is not effective for her, I believe she would benefit from psychiatry managing her condition.  Also encouraged her to look into acupuncture which can be extremely helpful for anxiety and her muscle skeletal complaints. Follow-up 5.5 months, sooner if needed.  Influenza vaccine administered today   Reviewed expectations re: course of current medical issues.  Discussed self-management of symptoms.  Outlined signs and symptoms indicating need for more acute intervention.  Patient verbalized understanding and all questions were answered.  Patient received  an After-Visit Summary.   Orders Placed This Encounter  Procedures  . Flu Vaccine QUAD 36+ mos IM  . B12  . Vitamin D (25 hydroxy)  . Ambulatory referral to Cardiology  . Ambulatory referral to Allergy   No orders of the defined types were placed in this encounter.   Referral Orders     Ambulatory referral to Cardiology     Ambulatory referral to Allergy   Note is dictated utilizing voice recognition software. Although note has been proof read prior to signing, occasional typographical errors still can be missed. If any questions arise, please do not hesitate to call for verification.   electronically signed by:  Howard Pouch, DO  [place]

## 2020-07-30 ENCOUNTER — Telehealth: Payer: Self-pay | Admitting: Family Medicine

## 2020-07-30 NOTE — Telephone Encounter (Signed)
Patient had lab orders for Friday that was unable to be collected.  She was asked to hydrate and return for lab appointment only.  As you please schedule the future labs placed from Friday's visit at [facility] for collection.  Her breast feel office is closer for her and more convenient.  Lastly, she had stool studies that were ordered.  She has the collection kits.  She does need further instruction on the collection of stool studies.  And that the could you please instruct her on proper collection of stool studies and returning them to [place] office to be completed if possible.

## 2020-08-01 NOTE — Telephone Encounter (Signed)
Called pt to get more information on what may be needed for lab work. Left message for pt to call back.

## 2020-08-03 NOTE — Telephone Encounter (Signed)
LM for pt to return call. Patient was given stool samples to complete. Fecal fat sample is frozen and red top. Stool, WBC sample is red top and room temp and last sample is Ova & parasite which is black top and room temp as well.

## 2020-08-03 NOTE — Telephone Encounter (Signed)
Pt returning call from [place].

## 2020-08-03 NOTE — Telephone Encounter (Signed)
Tried to explain stool studies and how to collect. Patient still did not understand directions. Will have Destiny Wade call her on Monday to explain. Lab appt made at [facility] for Tuesday afternoon, 10/12.

## 2020-08-06 ENCOUNTER — Other Ambulatory Visit: Payer: Self-pay | Admitting: Family Medicine

## 2020-08-06 DIAGNOSIS — R0600 Dyspnea, unspecified: Secondary | ICD-10-CM

## 2020-08-06 DIAGNOSIS — R079 Chest pain, unspecified: Secondary | ICD-10-CM

## 2020-08-06 NOTE — Addendum Note (Signed)
Addended by: Lerry Liner on: 08/06/2020 02:56 PM   Modules accepted: Orders

## 2020-08-06 NOTE — Telephone Encounter (Signed)
Please advise 

## 2020-08-06 NOTE — Telephone Encounter (Signed)
Attempted to contact pt to explain labs. Left vm for cb.

## 2020-08-07 ENCOUNTER — Other Ambulatory Visit: Payer: Self-pay

## 2020-08-07 ENCOUNTER — Other Ambulatory Visit: Payer: BC Managed Care – PPO

## 2020-08-07 DIAGNOSIS — E538 Deficiency of other specified B group vitamins: Secondary | ICD-10-CM

## 2020-08-07 DIAGNOSIS — E559 Vitamin D deficiency, unspecified: Secondary | ICD-10-CM

## 2020-08-07 LAB — VITAMIN D 25 HYDROXY (VIT D DEFICIENCY, FRACTURES): Vit D, 25-Hydroxy: 87 ng/mL (ref 30–100)

## 2020-08-07 LAB — VITAMIN B12: Vitamin B-12: 1316 pg/mL — ABNORMAL HIGH (ref 200–1100)

## 2020-08-09 ENCOUNTER — Other Ambulatory Visit: Payer: Self-pay | Admitting: Family Medicine

## 2020-08-18 ENCOUNTER — Other Ambulatory Visit: Payer: Self-pay | Admitting: Family Medicine

## 2020-08-22 ENCOUNTER — Ambulatory Visit: Payer: BC Managed Care – PPO | Admitting: Internal Medicine

## 2020-08-22 ENCOUNTER — Other Ambulatory Visit: Payer: Self-pay

## 2020-08-22 ENCOUNTER — Encounter: Payer: Self-pay | Admitting: Internal Medicine

## 2020-08-22 VITALS — BP 112/68 | HR 70 | Ht 69.0 in | Wt 129.2 lb

## 2020-08-22 DIAGNOSIS — R0609 Other forms of dyspnea: Secondary | ICD-10-CM | POA: Insufficient documentation

## 2020-08-22 DIAGNOSIS — R0600 Dyspnea, unspecified: Secondary | ICD-10-CM

## 2020-08-22 DIAGNOSIS — R079 Chest pain, unspecified: Secondary | ICD-10-CM | POA: Diagnosis not present

## 2020-08-22 NOTE — Progress Notes (Signed)
Cardiology Office Note:    Date:  08/22/2020   ID:  Destiny Wade, DOB 05-24-1986, MRN [id number]  PCP:  Natalia Leatherwood, DO  [facility] Cardiologist:  No primary care provider on file.   Referring MD: Natalia Leatherwood, DO   [id number] chest pain Consulted for the evaluation of chest pain at the behest of Sprague, Renee A, DO  History of Present Illness:    Destiny Wade is a 34 y.o. female with a hx of history of Depression, OCD trait, Generalized abdominal pain, diarrhea, Polyarthralgia, and myalgia, who presents with running with exertion.  Patient notes that she was getting back into exercising June or July 2020.  Was able to rune 2.5 miles a day at that time.  Recently, only able to go less than a mile without shortness of breath or DOE. With going up and down the stairs notes this pain and fatigue. Has sternal chest pain on the right side of her chest presently.  Constant pain 5/10.  Pain has occurred for several months.  Notes bruising over her chest.  (noticed over the right side of the chest).  Notes neck pain and tightness and right shoulder pain.  Has had two episodes of syncope but in the setting of low blood sugar.  Past Medical History:  Diagnosis Date  . Anxiety   . Depression    on antidepressants in the past, never SI or hospitalization  . IBS (irritable bowel syndrome)   . UTI (lower urinary tract infection)     Past Surgical History:  Procedure Laterality Date  . eye surgery  2006   . TYMPANOSTOMY TUBE PLACEMENT  Age 83   . WISDOM TOOTH EXTRACTION  2004     Current Medications: Current Meds  Medication Sig  . Cyanocobalamin (B-12) 2000 MCG TABS Place 2,000 mcg under the tongue every other day. B12 sublingual solution  . dicyclomine (BENTYL) 10 MG capsule Take 1 capsule (10 mg total) by mouth 4 (four) times daily -  before meals and at bedtime.  Marland Kitchen etonogestrel-ethinyl estradiol (NUVARING) 0.12-0.015 MG/24HR vaginal ring Insert vaginally and leave  in place for 3 consecutive weeks,and insert new Nuvaring for continuous use  . fluticasone (FLONASE) 50 MCG/ACT nasal spray Place 2 sprays into both nostrils daily.  Marland Kitchen PARoxetine (PAXIL) 20 MG tablet Take 1 tablet (20 mg total) by mouth daily.     Allergies:   Ceclor [cefaclor], Effexor [venlafaxine], Penicillins, and Sulfa antibiotics   Social History   Socioeconomic History  . Marital status: Single    Spouse name: Not on file  . Number of children: Not on file  . Years of education: Not on file  . Highest education level: Not on file  Occupational History  . Not on file  Tobacco Use  . Smoking status: Never Smoker  . Smokeless tobacco: Never Used  Vaping Use  . Vaping Use: Never used  Substance and Sexual Activity  . Alcohol use: Yes    Alcohol/week: 2.0 - 3.0 standard drinks    Types: 2 - 3 Standard drinks or equivalent per week  . Drug use: No  . Sexual activity: Yes    Partners: Male    Birth control/protection: Inserts    Comment: nuvaring  Other Topics Concern  . Not on file  Social History Narrative   Work or School: works at [employer] Situation: lives with roommate      Spiritual Beliefs: Ephriam Knuckles  Lifestyle: regular CV exercise 3-4 times per week; good diet            Social Determinants of Health   Financial Resource Strain:   . Difficulty of Paying Living Expenses: Not on file  Food Insecurity:   . Worried About Programme researcher, broadcasting/film/video in the Last Year: Not on file  . Ran Out of Food in the Last Year: Not on file  Transportation Needs:   . Lack of Transportation (Medical): Not on file  . Lack of Transportation (Non-Medical): Not on file  Physical Activity:   . Days of Exercise per Week: Not on file  . Minutes of Exercise per Session: Not on file  Stress:   . Feeling of Stress : Not on file  Social Connections:   . Frequency of Communication with Friends and Family: Not on file  . Frequency of Social Gatherings with Friends  and Family: Not on file  . Attends Religious Services: Not on file  . Active Member of Clubs or Organizations: Not on file  . Attends Banker Meetings: Not on file  . Marital Status: Not on file    Family History: The patient's family history includes Fibromyalgia in her maternal grandmother; Kidney disease in her maternal aunt; Lupus in her maternal grandmother. She was adopted. Birth family has DM.  ROS:   Please see the history of present illness.    All other systems reviewed and are negative.  EKGs/Labs/Other Studies Reviewed:    The following studies were reviewed today:  EKG:  EKG is ordered today.  The ekg ordered today demonstrates sinus rhythm with rate of 70 no ST T changes  Recent Labs: 05/17/2020: ALT 22; BUN 14; Creatinine, Ser 0.77; Hemoglobin 13.6; Platelets 267.0; Potassium 4.4; Sodium 137; TSH 2.17   Physical Exam:    VS:  BP 112/68   Pulse 70   Ht 5\' 9"  (1.753 m)   Wt 129 lb 3.2 oz (58.6 kg)   SpO2 98%   BMI 19.08 kg/m     Wt Readings from Last 3 Encounters:  08/22/20 129 lb 3.2 oz (58.6 kg)  07/27/20 126 lb (57.2 kg)  06/11/20 127 lb 9.6 oz (57.9 kg)    GEN:  Well nourished, well developed in no acute distress HEENT: Normal NECK: No JVD; No carotid bruits LYMPHATICS: No lymphadenopathy CARDIAC: RRR, no RV heave, murmurs, rubs, gallops RESPIRATORY:  Clear to auscultation without rales, wheezing or rhonchi  ABDOMEN: Soft, non-tender, non-distended MUSCULOSKELETAL:  No edema; No deformity  SKIN: Warm and dry NEUROLOGIC:  Alert and oriented x 3 PSYCHIATRIC:  Anxious mood and affect  ASSESSMENT:    1. Chest pain of uncertain etiology   2. DOE (dyspnea on exertion)    PLAN:    In order of problems listed above:  Chest Pain and DOE with query of rheumatologic event - will get an Echocardiogram - will get exercise ECG Stress Test  4-5 month follow up unless new symptoms or abnormal test results warranting change in plan  Would  be reasonable for Virtual Follow up Would be reasonable for APP Follow up   Medication Adjustments/Labs and Tests Ordered: Current medicines are reviewed at length with the patient today.  Concerns regarding medicines are outlined above.  No orders of the defined types were placed in this encounter.  No orders of the defined types were placed in this encounter.   There are no Patient Instructions on file for this visit.   Signed, Neelah Mannings  Richardean Chimera, MD  08/22/2020 4:58 PM    [facility]

## 2020-08-22 NOTE — Patient Instructions (Signed)
Medication Instructions:   Your physician recommends that you continue on your current medications as directed. Please refer to the Current Medication list given to you today.  *If you need a refill on your cardiac medications before your next appointment, please call your pharmacy*  Testing/Procedures:  Your physician has requested that you have an echocardiogram. Echocardiography is a painless test that uses sound waves to create images of your heart. It provides your doctor with information about the size and shape of your heart and how well your heart's chambers and valves are working. This procedure takes approximately one hour. There are no restrictions for this procedure.  Your physician has requested that you have an exercise tolerance test. For further information please visit [email or web address] Please also follow instruction sheet, as given.   Follow-Up:  4-5 MONTHS IN THE OFFICE WITH DR. Izora Ribas

## 2020-09-14 ENCOUNTER — Ambulatory Visit (HOSPITAL_COMMUNITY): Payer: BC Managed Care – PPO | Attending: Internal Medicine

## 2020-09-14 ENCOUNTER — Other Ambulatory Visit: Payer: Self-pay

## 2020-09-14 DIAGNOSIS — R079 Chest pain, unspecified: Secondary | ICD-10-CM

## 2020-09-14 DIAGNOSIS — R0600 Dyspnea, unspecified: Secondary | ICD-10-CM | POA: Insufficient documentation

## 2020-09-14 DIAGNOSIS — R0609 Other forms of dyspnea: Secondary | ICD-10-CM

## 2020-09-14 LAB — ECHOCARDIOGRAM COMPLETE
Area-P 1/2: 2.96 cm2
S' Lateral: 2.5 cm

## 2020-09-14 IMAGING — DX DG CHEST 2V
2 series · 2 of 2 positions shown · non-contrast
Comparison: None.

CLINICAL DATA: Chest discomfort

EXAM:
CHEST - 2 VIEW

[dg chest 2 view (1 of 2)]
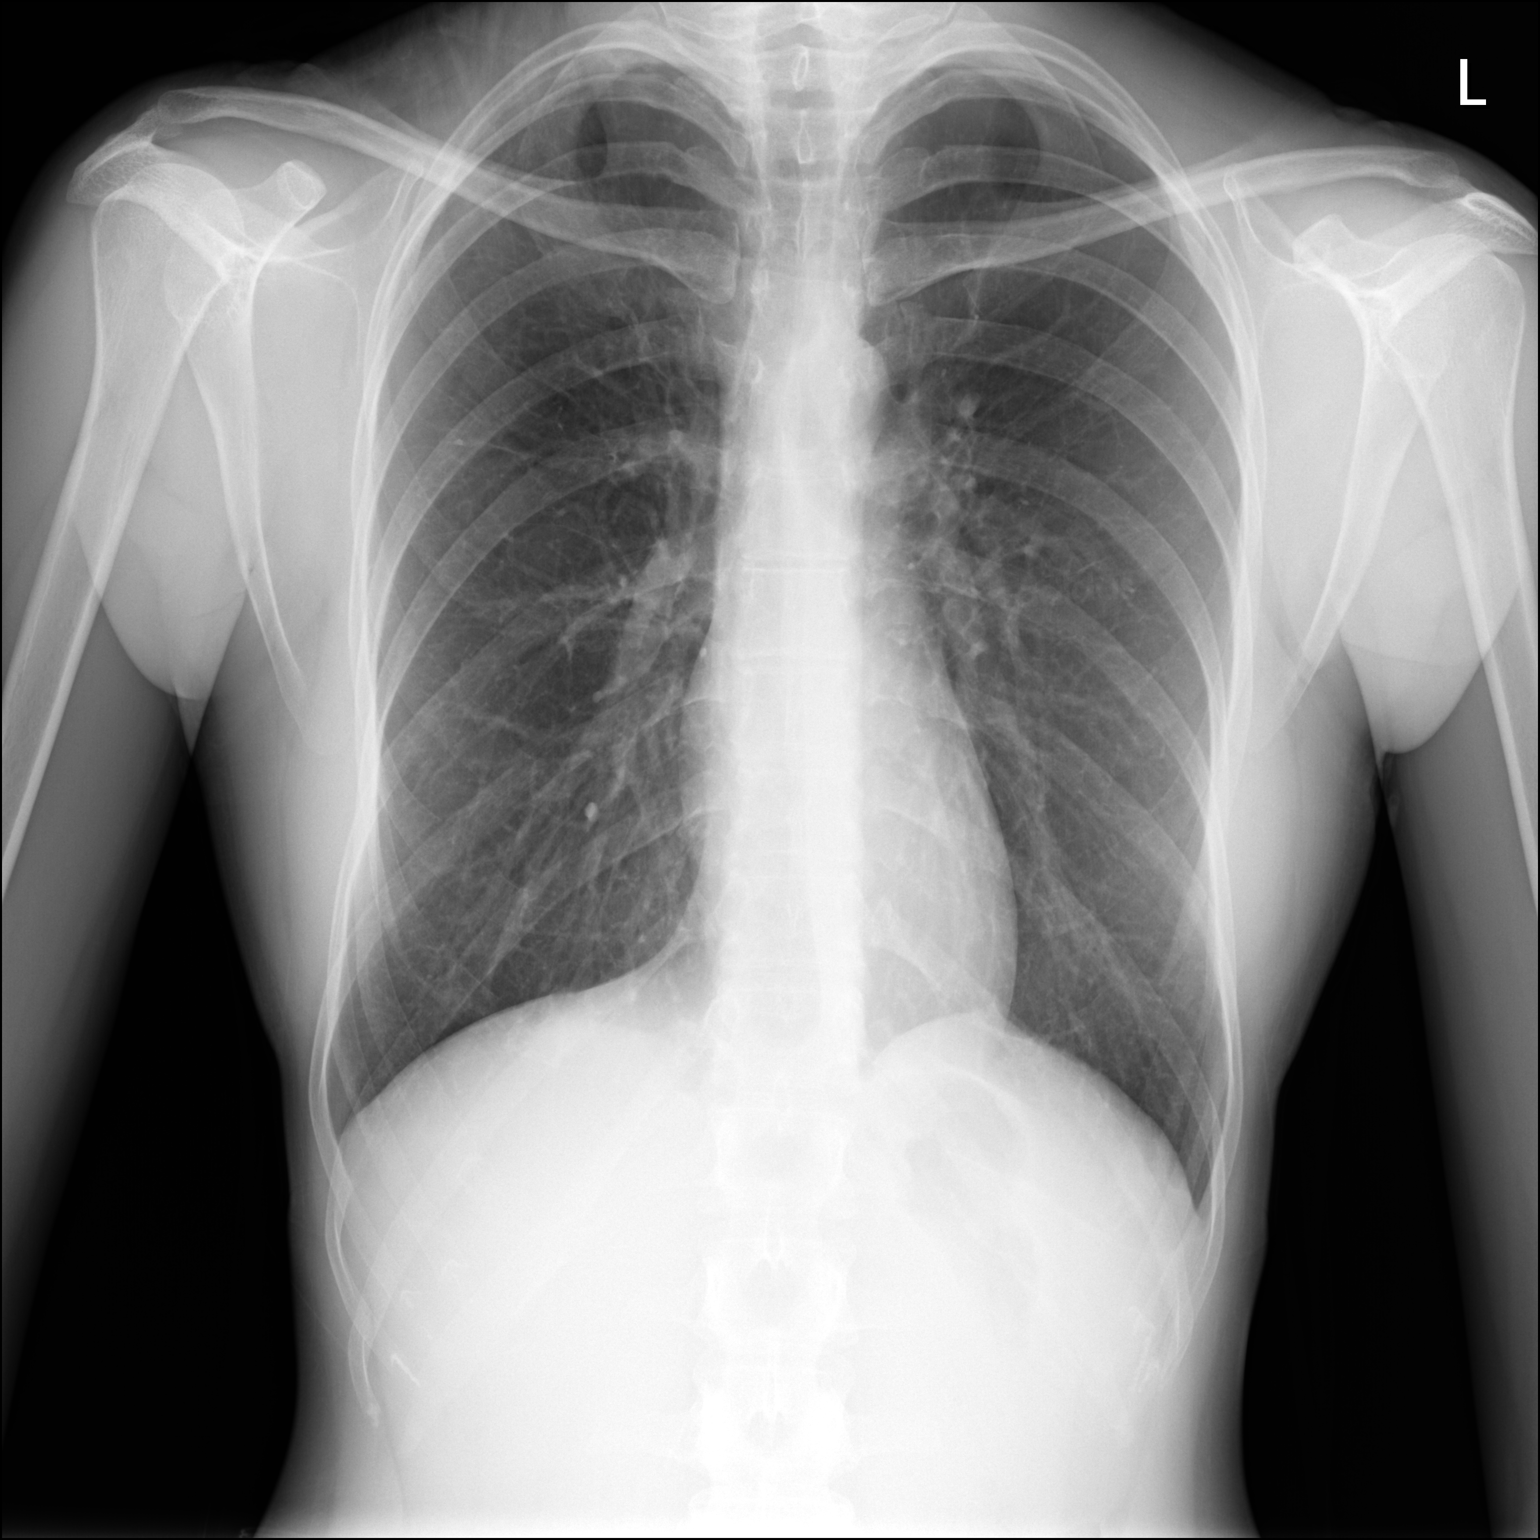

[dg chest 2 view (2 of 2)]
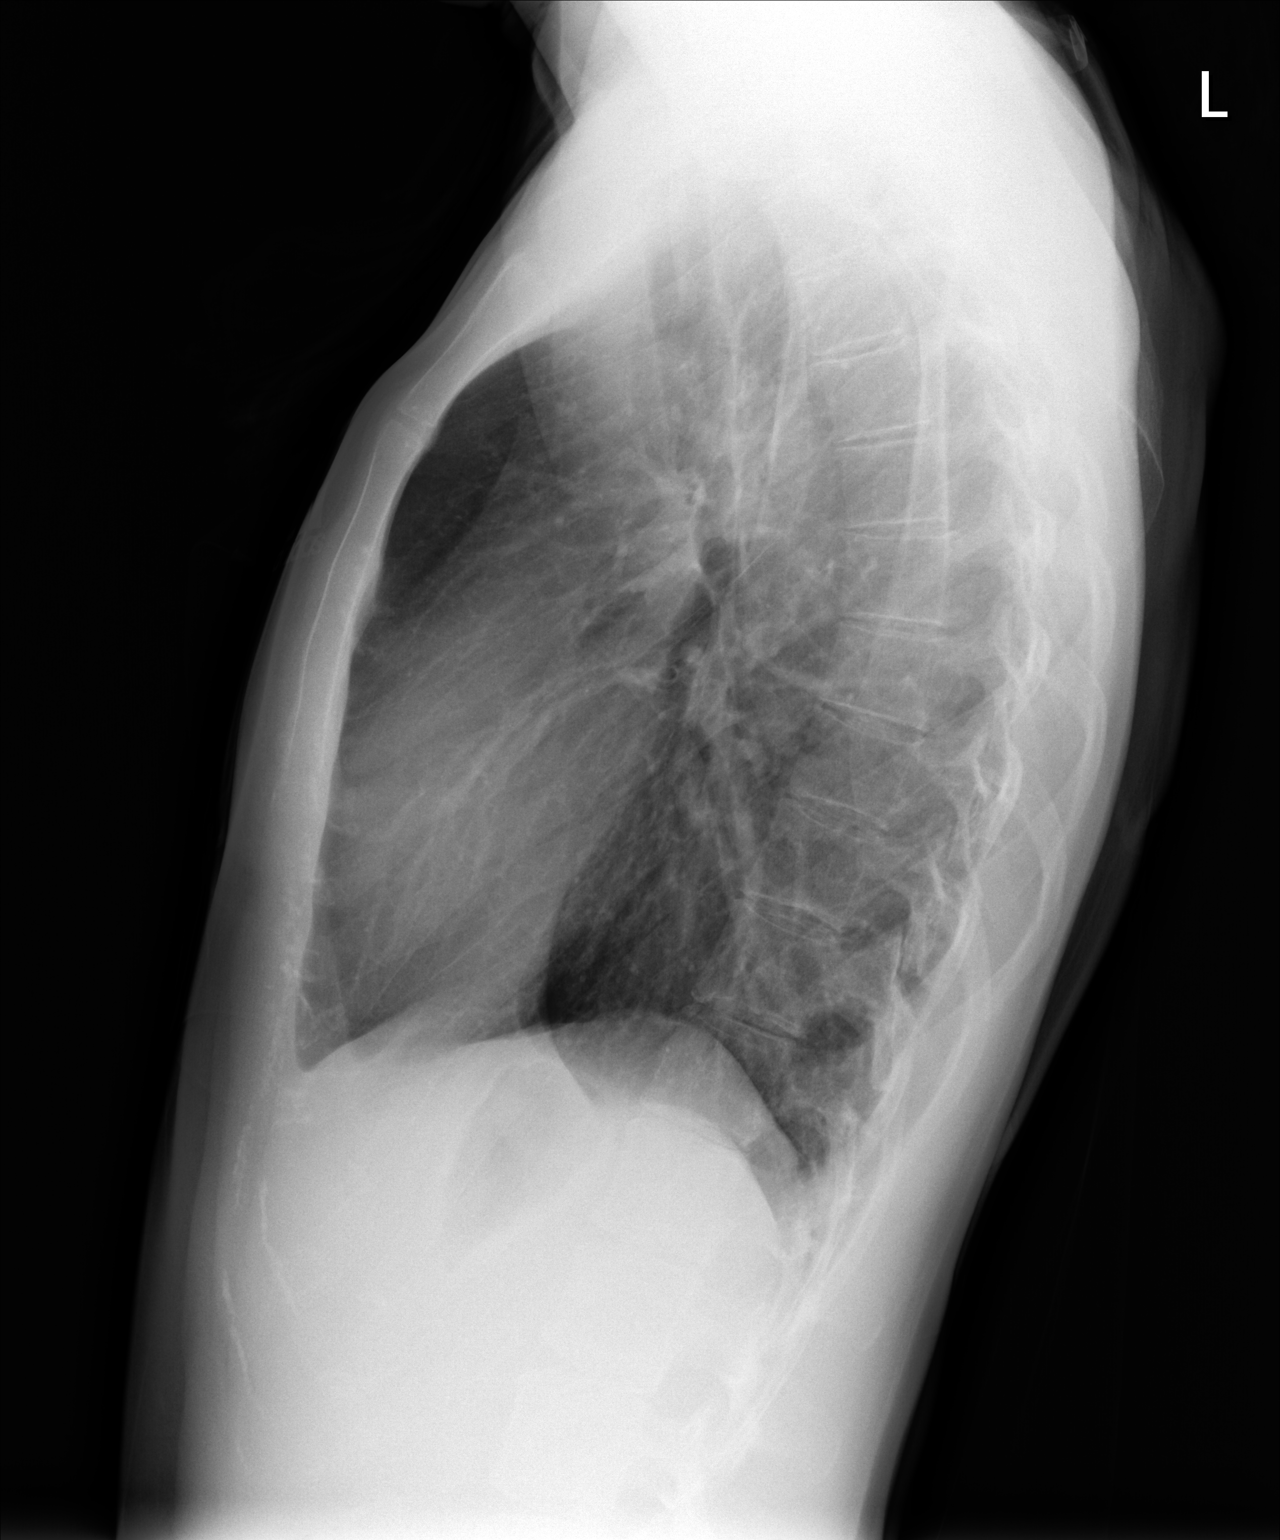

[2 of 2 positions shown; findings below may reference images not displayed]

FINDINGS: The heart size and mediastinal contours are within normal limits.
Both lungs are clear. The visualized skeletal structures are
unremarkable.
IMPRESSION: No active cardiopulmonary disease.

## 2020-09-28 ENCOUNTER — Ambulatory Visit: Payer: BC Managed Care – PPO | Admitting: Allergy

## 2020-10-18 ENCOUNTER — Other Ambulatory Visit: Payer: Self-pay | Admitting: Obstetrics and Gynecology

## 2020-10-18 DIAGNOSIS — Z3044 Encounter for surveillance of vaginal ring hormonal contraceptive device: Secondary | ICD-10-CM

## 2020-10-22 ENCOUNTER — Ambulatory Visit: Payer: BC Managed Care – PPO | Admitting: Nurse Practitioner

## 2020-10-22 ENCOUNTER — Other Ambulatory Visit: Payer: Self-pay

## 2020-10-22 ENCOUNTER — Encounter: Payer: Self-pay | Admitting: Nurse Practitioner

## 2020-10-22 VITALS — BP 108/64 | HR 68 | Resp 16 | Ht 69.0 in | Wt 128.0 lb

## 2020-10-22 DIAGNOSIS — Z01419 Encounter for gynecological examination (general) (routine) without abnormal findings: Secondary | ICD-10-CM

## 2020-10-22 DIAGNOSIS — Z3044 Encounter for surveillance of vaginal ring hormonal contraceptive device: Secondary | ICD-10-CM | POA: Diagnosis not present

## 2020-10-22 DIAGNOSIS — Z113 Encounter for screening for infections with a predominantly sexual mode of transmission: Secondary | ICD-10-CM | POA: Diagnosis not present

## 2020-10-22 MED ORDER — ETONOGESTREL-ETHINYL ESTRADIOL 0.12-0.015 MG/24HR VA RING: VAGINAL_RING | VAGINAL | 4 refills | Status: AC

## 2020-10-22 NOTE — Patient Instructions (Signed)
Stark Klein: [email or web address]   Premenstrual Syndrome Premenstrual syndrome (PMS) is a group of physical, emotional, and behavioral symptoms that affect women of childbearing age as part of their menstrual cycle. PMS starts 1-2 weeks before the start of a woman's menstrual period and goes away a few days after menstrual bleeding starts. It often happens in a predictable pattern (recurs). PMS may cause other health conditions to become worse, such as asthma, allergies, and migraines. PMS can range from mild to severe. When it is severe, it is called premenstrual dysphoric disorder (PMDD). PMS may interfere with normal daily activities. What are the causes? The cause of this condition is not known, but it seems to be related to hormone changes that happen before menstruation. What are the signs or symptoms? Symptoms of this condition often happen every month. They go away completely after your period starts. Physical symptoms of this condition include:  Bloating.  Breast pain.  Headaches.  Extreme fatigue.  Backaches.  Swelling of the hands and feet.  Weight gain.  Hot flashes. Emotional and behavioral symptoms of this condition include:  Mood swings.  Depression.  Angry outbursts.  Irritability.  Anxiety.  Crying spells.  Food cravings or appetite changes.  Changes in sexual desire.  Confusion.  Aggression.  Social withdrawal.  Poor concentration. How is this diagnosed? This condition may be diagnosed based on a history of your symptoms. This condition is generally diagnosed if symptoms of PMS:  Are present in the 5 days before your period starts.  End within 4 days after your period starts.  Happen at least 3 months in a row.  Interfere with some of your normal activities. Other conditions that can cause some of these symptoms must be ruled out before PMS can be diagnosed. These include depression, anxiety, anemia, and thyroid problems. How is  this treated? This condition may be treated by:  Maintaining a healthy lifestyle. This includes eating a well-balanced diet and exercising regularly.  Taking medicines. Medicines can help relieve symptoms such as cramps, aches, pains, headaches, and breast tenderness. Depending on the severity of the condition, your health care provider may recommend various over-the-counter pain medicines. Follow these instructions at home: Eating and drinking   Eat a well-balanced diet.  Avoid caffeine and alcohol.  Limit the amount of salt and salty foods you eat. This will help reduce bloating.  Drink enough fluid to keep your urine pale yellow.  Take a multivitamin if told to do so by your health care provider. Lifestyle   Do not use any products that contain nicotine or tobacco, such as cigarettes, e-cigarettes, and chewing tobacco. If you need help quitting, ask your health care provider.  Exercise regularly as suggested by your health care provider.  Get enough sleep. For most adults, this is 7-8 hours of sleep each night.  Practice relaxation techniques such as yoga, tai chi, or meditation.  Find healthy ways to manage stress. General instructions   For 2-3 months, write down your symptoms, their severity, and how long they last. This will help your health care provider choose the best treatment for you.  Take over-the-counter and prescription medicines only as told by your health care provider.  If you are using birth control pills (oral contraceptives), use them as told by your health care provider. Contact a health care provider if:  Your symptoms get worse.  You develop new symptoms.  You have trouble doing your daily activities. Summary  Premenstrual syndrome (PMS) is a group of physical,  emotional, and behavioral symptoms that affect women of childbearing age.  PMS starts 1-2 weeks before the start of a woman's period and goes away a few days after the period  starts.  PMS is treated by maintaining a healthy lifestyle and taking medicines to relieve the symptoms. This information is not intended to replace advice given to you by your health care provider. Make sure you discuss any questions you have with your health care provider. Document Revised: 05/26/2018 Document Reviewed: 05/26/2018 Elsevier Patient Education  2020 [place] Maintenance, Female Adopting a healthy lifestyle and getting preventive care are important in promoting health and wellness. Ask your health care provider about:  The right schedule for you to have regular tests and exams.  Things you can do on your own to prevent diseases and keep yourself healthy. What should I know about diet, weight, and exercise? Eat a healthy diet   Eat a diet that includes plenty of vegetables, fruits, low-fat dairy products, and lean protein.  Do not eat a lot of foods that are high in solid fats, added sugars, or sodium. Maintain a healthy weight Body mass index (BMI) is used to identify weight problems. It estimates body fat based on height and weight. Your health care provider can help determine your BMI and help you achieve or maintain a healthy weight. Get regular exercise Get regular exercise. This is one of the most important things you can do for your health. Most adults should:  Exercise for at least 150 minutes each week. The exercise should increase your heart rate and make you sweat (moderate-intensity exercise).  Do strengthening exercises at least twice a week. This is in addition to the moderate-intensity exercise.  Spend less time sitting. Even light physical activity can be beneficial. Watch cholesterol and blood lipids Have your blood tested for lipids and cholesterol at 34 years of age, then have this test every 5 years. Have your cholesterol levels checked more often if:  Your lipid or cholesterol levels are high.  You are older than 34 years of  age.  You are at high risk for heart disease. What should I know about cancer screening? Depending on your health history and family history, you may need to have cancer screening at various ages. This may include screening for:  Breast cancer.  Cervical cancer.  Colorectal cancer.  Skin cancer.  Lung cancer. What should I know about heart disease, diabetes, and high blood pressure? Blood pressure and heart disease  High blood pressure causes heart disease and increases the risk of stroke. This is more likely to develop in people who have high blood pressure readings, are of African descent, or are overweight.  Have your blood pressure checked: ? Every 3-5 years if you are 55-70 years of age. ? Every year if you are 77 years old or older. Diabetes Have regular diabetes screenings. This checks your fasting blood sugar level. Have the screening done:  Once every three years after age 48 if you are at a normal weight and have a low risk for diabetes.  More often and at a younger age if you are overweight or have a high risk for diabetes. What should I know about preventing infection? Hepatitis B If you have a higher risk for hepatitis B, you should be screened for this virus. Talk with your health care provider to find out if you are at risk for hepatitis B infection. Hepatitis C Testing is recommended for:  Everyone born  from [place] through 1965.  Anyone with known risk factors for hepatitis C. Sexually transmitted infections (STIs)  Get screened for STIs, including gonorrhea and chlamydia, if: ? You are sexually active and are younger than 34 years of age. ? You are older than 34 years of age and your health care provider tells you that you are at risk for this type of infection. ? Your sexual activity has changed since you were last screened, and you are at increased risk for chlamydia or gonorrhea. Ask your health care provider if you are at risk.  Ask your health care  provider about whether you are at high risk for HIV. Your health care provider may recommend a prescription medicine to help prevent HIV infection. If you choose to take medicine to prevent HIV, you should first get tested for HIV. You should then be tested every 3 months for as long as you are taking the medicine. Pregnancy  If you are about to stop having your period (premenopausal) and you may become pregnant, seek counseling before you get pregnant.  Take 400 to 800 micrograms (mcg) of folic acid every day if you become pregnant.  Ask for birth control (contraception) if you want to prevent pregnancy. Osteoporosis and menopause Osteoporosis is a disease in which the bones lose minerals and strength with aging. This can result in bone fractures. If you are 36 years old or older, or if you are at risk for osteoporosis and fractures, ask your health care provider if you should:  Be screened for bone loss.  Take a calcium or vitamin D supplement to lower your risk of fractures.  Be given hormone replacement therapy (HRT) to treat symptoms of menopause. Follow these instructions at home: Lifestyle  Do not use any products that contain nicotine or tobacco, such as cigarettes, e-cigarettes, and chewing tobacco. If you need help quitting, ask your health care provider.  Do not use street drugs.  Do not share needles.  Ask your health care provider for help if you need support or information about quitting drugs. Alcohol use  Do not drink alcohol if: ? Your health care provider tells you not to drink. ? You are pregnant, may be pregnant, or are planning to become pregnant.  If you drink alcohol: ? Limit how much you use to 0-1 drink a day. ? Limit intake if you are breastfeeding.  Be aware of how much alcohol is in your drink. In the [place] one drink equals one 12 oz bottle of beer (355 mL), one 5 oz glass of wine (148 mL), or one 1 oz glass of hard liquor (44 mL). General  instructions  Schedule regular health, dental, and eye exams.  Stay current with your vaccines.  Tell your health care provider if: ? You often feel depressed. ? You have ever been abused or do not feel safe at home. Summary  Adopting a healthy lifestyle and getting preventive care are important in promoting health and wellness.  Follow your health care provider's instructions about healthy diet, exercising, and getting tested or screened for diseases.  Follow your health care provider's instructions on monitoring your cholesterol and blood pressure. This information is not intended to replace advice given to you by your health care provider. Make sure you discuss any questions you have with your health care provider. Document Revised: 10/06/2018 Document Reviewed: 10/06/2018 Elsevier Patient Education  2020 [employer].

## 2020-10-22 NOTE — Progress Notes (Signed)
34 y.o. G0P0000 Single White or Caucasian female here for annual exam.   Menstrual Flow:  (occ spotting, uses nuvaring continuous)   Reports RUQ, worse the day after sex. It has been going on x 6 months. Doesn't happen every time but frequently. Notices it at least twice per month, lasts most of the day on the day after sex. Does not notice N/V with the pain. States she thinks she vomits more than the average person, at least once per week. Can not attribute to what she eats or any other associated factor. Has long history of IBS, both constipation and diarrhea.  Desires STD testing, is engaged and wants to just be safe.  No LMP recorded. (Menstrual status: Other).          Sexually active: Yes.    The current method of family planning is NuvaRing vaginal inserts.    Exercising: Yes.    yoga & some running Smoker:  no  Health Maintenance: Pap:  11-05-17 neg HPV HR neg History of abnormal Pap: no MMG:  none Colonoscopy:  none BMD:   none TDaP:  2020 Gardasil:   No Covid-19: pfizer Hep C testing: not done Screening Labs: n/a   reports that she has never smoked. She has never used smokeless tobacco. She reports current alcohol use. She reports that she does not use drugs.  Past Medical History:  Diagnosis Date   Anxiety    Depression    on antidepressants in the past, never SI or hospitalization   IBS (irritable bowel syndrome)    UTI (lower urinary tract infection)     Past Surgical History:  Procedure Laterality Date   eye surgery  2006    TYMPANOSTOMY TUBE PLACEMENT  Age 58    WISDOM TOOTH EXTRACTION  2004     Current Outpatient Medications  Medication Sig Dispense Refill   Cyanocobalamin (B-12) 2000 MCG TABS Place 2,000 mcg under the tongue every other day. B12 sublingual solution     etonogestrel-ethinyl estradiol (NUVARING) 0.12-0.015 MG/24HR vaginal ring Insert vaginally and leave in place for 3 consecutive weeks,and insert new Nuvaring for continuous use 3  each 4   fluticasone (FLONASE) 50 MCG/ACT nasal spray Place 2 sprays into both nostrils daily. 16 g 6   No current facility-administered medications for this visit.    Family History  Adopted: Yes  Problem Relation Age of Onset   Kidney disease Maternal Aunt    Lupus Maternal Grandmother    Fibromyalgia Maternal Grandmother     Review of Systems  Constitutional: Negative.   HENT: Negative.   Eyes: Negative.   Respiratory: Negative.   Cardiovascular: Negative.   Gastrointestinal: Negative.   Endocrine: Negative.   Genitourinary:       Symptoms of pms, pains on right side under ribs during intercourse  Musculoskeletal: Negative.   Skin: Negative.   Allergic/Immunologic: Negative.   Neurological: Negative.   Hematological: Negative.   Psychiatric/Behavioral: Negative.     Exam:   BP 108/64    Pulse 68    Resp 16    Ht 5\' 9"  (1.753 m)    Wt 128 lb (58.1 kg)    BMI 18.90 kg/m   Height: 5\' 9"  (175.3 cm)  General appearance: alert, cooperative and appears stated age, no acute distress Head: Normocephalic, without obvious abnormality Neck: no adenopathy, thyroid normal to inspection and palpation Lungs: clear to auscultation bilaterally Breasts: normal appearance, no masses or tenderness Heart: regular rate and rhythm Abdomen: soft, non-tender; no  masses,  no organomegaly Extremities: extremities normal, no edema Skin: No rashes or lesions Lymph nodes: Cervical, supraclavicular, and axillary nodes normal. No abnormal inguinal nodes palpated Neurologic: Grossly normal   Pelvic: External genitalia:  no lesions              Urethra:  normal appearing urethra with no masses, tenderness or lesions              Bartholins and Skenes: normal                 Vagina: normal appearing vagina, appropriate for age, normal appearing discharge, no lesions              Cervix: neg cervical motion tenderness, no visible lesions             Bimanual Exam:   Uterus:  normal size,  contour, position, consistency, mobility, non-tender and anteverted              Adnexa: no mass, fullness, tenderness                 Joy, CMA Chaperone was present for exam.  A:  Well Woman with normal exam  Nuva ring surveillance  RUQ pain, likely GI  PMS  Std screening  P:   Pap : co-testing due 2024  Mammogram:n/a  Labs:NAAT GC/CT/Trich  Medications: Nuva ring continuous, RX sent  Encouraged to keep pain diary, encouraged to pay attention to see if notices more ofter when eating fatty foods. Will most likely recommend GI if persistent. Reassured that felt it was unlikely caused by sex.  Discussed alternatives to nuva ring to try to improve PMS symptoms (including Nexplanon, Mirena). Discussed natural methods: meditation, yoga, possible supplements.

## 2020-10-23 ENCOUNTER — Telehealth: Payer: Self-pay | Admitting: *Deleted

## 2020-10-23 ENCOUNTER — Other Ambulatory Visit: Payer: Self-pay | Admitting: Nurse Practitioner

## 2020-10-23 DIAGNOSIS — R1011 Right upper quadrant pain: Secondary | ICD-10-CM

## 2020-10-23 DIAGNOSIS — Z8719 Personal history of other diseases of the digestive system: Secondary | ICD-10-CM

## 2020-10-23 DIAGNOSIS — R1110 Vomiting, unspecified: Secondary | ICD-10-CM

## 2020-10-23 NOTE — Telephone Encounter (Signed)
-----   Message from Clarita Crane, NP sent at 10/23/2020 12:03 PM EST ----- Will you refer to [facility] for the reason of RUQ pain x 6 months and weekly vomiting. Pt also has long hx of IBS

## 2020-10-23 NOTE — Telephone Encounter (Signed)
Referral placed to [facility].   Routing to [employer] closed.

## 2020-10-23 NOTE — Progress Notes (Unsigned)
Reviewed office notes from previous providers. Called patient to see if she would like to follow up with PCP regarding her RUQ pain or if GI referral is preferred. She would like to see GI, feels like she has tried recommendations from PCP and not improving.

## 2020-10-24 LAB — CHLAMYDIA/GONOCOCCUS/TRICHOMONAS, NAA
Chlamydia by NAA: NEGATIVE
Gonococcus by NAA: NEGATIVE
Trich vag by NAA: NEGATIVE

## 2020-10-25 ENCOUNTER — Other Ambulatory Visit: Payer: Self-pay | Admitting: Family Medicine

## 2020-11-20 ENCOUNTER — Other Ambulatory Visit: Payer: Self-pay

## 2020-11-20 ENCOUNTER — Encounter: Payer: Self-pay | Admitting: Allergy and Immunology

## 2020-11-20 ENCOUNTER — Ambulatory Visit: Payer: BC Managed Care – PPO | Admitting: Allergy and Immunology

## 2020-11-20 VITALS — BP 108/74 | HR 88 | Temp 98.2°F | Resp 20 | Ht 68.0 in | Wt 125.8 lb

## 2020-11-20 DIAGNOSIS — R0600 Dyspnea, unspecified: Secondary | ICD-10-CM

## 2020-11-20 DIAGNOSIS — J4530 Mild persistent asthma, uncomplicated: Secondary | ICD-10-CM | POA: Diagnosis not present

## 2020-11-20 DIAGNOSIS — K219 Gastro-esophageal reflux disease without esophagitis: Secondary | ICD-10-CM | POA: Diagnosis not present

## 2020-11-20 DIAGNOSIS — R0609 Other forms of dyspnea: Secondary | ICD-10-CM

## 2020-11-20 DIAGNOSIS — R197 Diarrhea, unspecified: Secondary | ICD-10-CM | POA: Diagnosis not present

## 2020-11-20 DIAGNOSIS — L710 Perioral dermatitis: Secondary | ICD-10-CM

## 2020-11-20 NOTE — Progress Notes (Signed)
[place] - [place] - [place] - [place] - [place]   Dear Dr. Claiborne Billings,  Thank you for referring Destiny Wade to the [facility] [place] on 11/20/2020.   Below is a summation of this patient's evaluation and recommendations.  Thank you for your referral. I will keep you informed about this patient's response to treatment.   If you have any questions please do not hesitate to contact me.   Sincerely,  Jessica Priest, MD Allergy / Immunology [facility]   ______________________________________________________________________    NEW PATIENT NOTE  Referring Provider: Natalia Leatherwood, DO Primary Provider: Natalia Leatherwood, DO Date of office visit: 11/20/2020    Subjective:   Chief Complaint:  Destiny Wade (DOB: Nov 16, 1985) is a 35 y.o. female who presents to the clinic on 11/20/2020 with a chief complaint of Asthma (Exercise induced shortness of breath) .     HPI: Destiny Wade presents to this clinic in evaluation of exercise-induced breathing problems.  Since December 2020 she has noticed a dramatic drop off in her ability to exercise. Prior to that point in time she could run multiple miles per day with no difficulty at all. Since that point in time she has a limitation in her ability to exercise. She feels as though she just can't get enough air. There have been times when she will develop nausea associated with this air hunger. She does not have any associated dizziness but she does have chest pain with this issue and she just feels very fatigued. There is no other associated respiratory symptoms and there is no other associated systemic or constitutional symptoms.  She has seen a cardiologist regarding this issue and apparently has had a normal EKG and a chest x-ray and an echocardiogram. She is scheduled for a stress test at some point in the future.  She does have upper respiratory  tract symptoms in that she sneezes all the time and she uses some over-the-counter antihistamine for this issue.  She does have anxiety and no treatment to date is really helped this issue. She feels nervous and worried and stressed out and sometimes paranoid. She does drink one caffeinated coffee per day and occasionally has soda and chocolate. Her sleep is going quite well and she does not have problems initiating sleep or maintaining sleep.  She has diarrhea multiple times per day which has been a longstanding issue of many years and she seen a gastroenterologist in the past but no therapy to date has helped this issue. She will take Imodium on occasion. She will have diarrhea up to three times per day. She also appears to have regurgitation and she has frank vomiting upon awakening in the morning about twice a week.  In addition, she develops the cystic lesions around her mouth that seem to last for several weeks and then resolve.  She has tried acne medications which have not helped this issue.  Past Medical History:  Diagnosis Date   Anxiety    Depression    on antidepressants in the past, never SI or hospitalization   IBS (irritable bowel syndrome)    UTI (lower urinary tract infection)     Past Surgical History:  Procedure Laterality Date   eye surgery  2006    TYMPANOSTOMY TUBE PLACEMENT  Age 26    WISDOM TOOTH EXTRACTION  2004     Allergies as of 11/20/2020      Reactions  Ceclor [cefaclor]    Effexor [venlafaxine]    Chest tightness and shortness of breath   Penicillins    Unsure of reaction because was a baby   Sulfa Antibiotics       Medication List      B-12 2000 MCG Tabs Place 2,000 mcg under the tongue every other day. B12 sublingual solution   etonogestrel-ethinyl estradiol 0.12-0.015 MG/24HR vaginal ring Commonly known as: NuvaRing Insert vaginally and leave in place for 3 consecutive weeks,and insert new Nuvaring for continuous use   fluticasone 50  MCG/ACT nasal spray Commonly known as: FLONASE Place 2 sprays into both nostrils daily.       Review of systems negative except as noted in HPI / PMHx or noted below:  Review of Systems  Constitutional: Negative.   HENT: Negative.   Eyes: Negative.   Respiratory: Negative.   Cardiovascular: Negative.   Gastrointestinal: Negative.   Genitourinary: Negative.   Musculoskeletal: Negative.   Skin: Negative.   Neurological: Negative.   Endo/Heme/Allergies: Negative.   Psychiatric/Behavioral: Negative.     Family History  Adopted: Yes  Problem Relation Age of Onset   Kidney disease Maternal Aunt    Lupus Maternal Grandmother    Fibromyalgia Maternal Grandmother     Social History   Socioeconomic History   Marital status: Single    Spouse name: Not on file   Number of children: Not on file   Years of education: Not on file   Highest education level: Not on file  Occupational History   Not on file  Tobacco Use   Smoking status: Never Smoker   Smokeless tobacco: Never Used  Vaping Use   Vaping Use: Never used  Substance and Sexual Activity   Alcohol use: Yes    Alcohol/week: 0.0 - 3.0 standard drinks   Drug use: No   Sexual activity: Yes    Partners: Male    Birth control/protection: Inserts    Comment: nuvaring  Other Topics Concern   Not on file  Social History Narrative   Work or School: works at [employer]      Home Situation: lives with roommate      Spiritual Beliefs: Christian      Lifestyle: regular CV exercise 3-4 times per week; good diet            Environmental and Social history  Lives in a apartment with a dry environment, no animals located inside the household, carpet in the bedroom, no plastic on the bed, no plastic on the pillow, no smoking ongoing with inside the household. She works as a Runner, broadcasting/film/videoteacher.  Objective:   Vitals:   11/20/20 1446  BP: 108/74  Pulse: 88  Resp: 20  Temp: 98.2 F (36.8 C)  SpO2: 97%    Height: 5\' 8"  (172.7 cm) Weight: 125 lb 12.8 oz (57.1 kg)  Physical Exam Constitutional:      Appearance: She is not diaphoretic.  HENT:     Head: Normocephalic.     Right Ear: Tympanic membrane, ear canal and external ear normal.     Left Ear: Tympanic membrane, ear canal and external ear normal.     Nose: Nose normal. No mucosal edema or rhinorrhea.     Mouth/Throat:     Mouth: Oropharynx is clear and moist and mucous membranes are normal.     Pharynx: Uvula midline. No oropharyngeal exudate.  Eyes:     Conjunctiva/sclera: Conjunctivae normal.  Neck:     Thyroid: No thyromegaly.  Trachea: Trachea normal. No tracheal tenderness or tracheal deviation.  Cardiovascular:     Rate and Rhythm: Normal rate and regular rhythm.     Heart sounds: Normal heart sounds, S1 normal and S2 normal. No murmur heard.   Pulmonary:     Effort: No respiratory distress.     Breath sounds: Normal breath sounds. No stridor. No wheezing or rales.  Musculoskeletal:        General: No edema.  Lymphadenopathy:     Head:     Right side of head: No tonsillar adenopathy.     Left side of head: No tonsillar adenopathy.     Cervical: No cervical adenopathy.  Skin:    Findings: Rash (4 mm erythematous slightly indurated lesion left chin) present. No erythema.     Nails: There is no clubbing.  Neurological:     Mental Status: She is alert.     Diagnostics: Allergy skin tests were performed.  She did not demonstrate any hypersensitivity against a screening panel of aeroallergens or foods.  Spirometry was performed and demonstrated an FEV1 of 3.24 @ 92 % of predicted.   The patient had an Asthma Control Test with the following results: ACT Total Score: 19.    Results of a echocardiogram obtained 14 September 2020 identified the following:  1. Left ventricular ejection fraction, by estimation, is 60 to 65%. The  left ventricle has normal function. The left ventricle has no regional  wall motion  abnormalities. Left ventricular diastolic parameters were  normal.  2. Right ventricular systolic function is normal. The right ventricular  size is normal. There is normal pulmonary artery systolic pressure.  3. The mitral valve is abnormal. Trivial mitral valve regurgitation. No  evidence of mitral stenosis.  4. The aortic valve is tricuspid. Aortic valve regurgitation is not  visualized. No aortic stenosis is present.  5. The inferior vena cava is normal in size with greater than 50%  respiratory variability, suggesting right atrial pressure of 3 mmHg.   Results of the chest x-ray obtained 28 May 2020 identified the following:  The heart size and mediastinal contours are within normal limits. Both lungs are clear. The visualized skeletal structures are Unremarkable.  Results of blood tests obtained 17 May 2020 identified creatinine 0.75 mg/DL, AST 19 U/L, ALT 20 2U/L, WBC 5.2, absolute eosinophil 200, absolute lymphocyte 1700, hemoglobin 13.6, platelet 267, TSH 2.17 IU/mL, free T4 0.89 NG/DL ANA with titer 1: 40 speckled with negative ENA, RA, CCP.  Assessment and Plan:    1. Not well controlled mild persistent asthma   2. DOE (dyspnea on exertion)   3. Diarrhea, unspecified type   4. Gastroesophageal reflux disease, unspecified whether esophagitis present   5. Perioral dermatitis     1. Allergen avoidance measures?  2. Eliminate all forms of caffeine and chocolate consumption slowly  3. Treat and prevent inflammation of airway:   A. Flonase - 1-2 sprays each nostril one time per day  B. Flovent 110 -2 inhalations one time per day with spacer  4. Treat and prevent classic reflux / reflux induced respiratory disease:   A. Omeprazole 40 mg -1 tablet one time per day  5. Arrange for cardiopulmonary exercise test at pulmonology office  6. If needed:   A. OTC antihistamine -Claritin/Zyrtec   B. Albuterol HFA -2 inhalations one time per day  C. MetroCream applied  around mouth 1-2 times per day  7. Blood -celiac screen with IgA  8. Return to clinic in  4 weeks or earlier if problem  Destiny Wade has exercise-induced respiratory tract symptoms with an unclear etiology.  This may be related to inflammation of her airway or hyperventilation associated with anxiety or a component of reflux induced respiratory disease.  I have made suggestions as noted above to address both inflammation of her airway and reflux induced respiratory disease.  We will further investigate this issue with a cardiopulmonary exercise test.  In addition, she does have longstanding diarrhea and we will check a celiac screen to rule out gluten hypersensitivity.  She does not appear to have an IgE mediated disease contributing to her diarrhea based on today skin test results.  She may benefit from colestipol if she continues to have diarrhea on a consistent basis and she may require evaluation of her colon if she continues to have diarrhea on a consistent basis.  I will see her back in this clinic in 4 weeks or earlier if there is a problem.  Jessica Priest, MD Allergy / Immunology [facility] [place]

## 2020-11-20 NOTE — Patient Instructions (Addendum)
  1. Allergen avoidance measures?  2. Eliminate all forms of caffeine and chocolate consumption slowly  3. Treat and prevent inflammation of airway:   A. Flonase - 1-2 sprays each nostril one time per day  B. Flovent 110 -2 inhalations one time per day with spacer  4. Treat and prevent classic reflux / reflux induced respiratory disease:   A. Omeprazole 40 mg -1 tablet one time per day  5. Arrange for cardiopulmonary exercise test at pulmonology office  6. If needed:   A. OTC antihistamine -Claritin/Zyrtec   B. Albuterol HFA -2 inhalations one time per day  C. MetroCream applied around mouth 1-2 times per day  7. Blood -celiac screen with IgA  8. Return to clinic in 4 weeks or earlier if problem

## 2020-11-21 ENCOUNTER — Encounter: Payer: Self-pay | Admitting: Allergy and Immunology

## 2020-11-21 LAB — GLIA (IGA/G) + TTG IGA
Antigliadin Abs, IgA: 3 units (ref 0–19)
Gliadin IgG: 1 units (ref 0–19)
Transglutaminase IgA: <2 U/mL (ref 0–3)

## 2020-11-21 MED ORDER — FLOVENT HFA 110 MCG/ACT IN AERO: 2.0000 | INHALATION_SPRAY | Freq: Every day | RESPIRATORY_TRACT | 5 refills | Status: DC

## 2020-11-21 MED ORDER — ALBUTEROL SULFATE HFA 108 (90 BASE) MCG/ACT IN AERS: 2.0000 | INHALATION_SPRAY | Freq: Four times a day (QID) | RESPIRATORY_TRACT | 2 refills | Status: DC | PRN

## 2020-11-21 MED ORDER — METRONIDAZOLE 0.75 % EX CREA: TOPICAL_CREAM | Freq: Two times a day (BID) | CUTANEOUS | 3 refills | Status: DC

## 2020-11-21 MED ORDER — OMEPRAZOLE 40 MG PO CPDR: 40.0000 mg | DELAYED_RELEASE_CAPSULE | Freq: Every day | ORAL | 5 refills | Status: DC

## 2020-11-21 MED ORDER — FLUTICASONE PROPIONATE 50 MCG/ACT NA SUSP: 1.0000 | Freq: Every day | NASAL | 5 refills | Status: AC

## 2020-12-18 ENCOUNTER — Encounter: Payer: Self-pay | Admitting: Allergy and Immunology

## 2020-12-18 ENCOUNTER — Other Ambulatory Visit: Payer: Self-pay

## 2020-12-18 ENCOUNTER — Ambulatory Visit: Payer: BC Managed Care – PPO | Admitting: Allergy and Immunology

## 2020-12-18 VITALS — BP 118/80 | HR 80 | Temp 98.2°F | Resp 18 | Ht 68.0 in | Wt 128.8 lb

## 2020-12-18 DIAGNOSIS — R197 Diarrhea, unspecified: Secondary | ICD-10-CM | POA: Diagnosis not present

## 2020-12-18 DIAGNOSIS — L710 Perioral dermatitis: Secondary | ICD-10-CM

## 2020-12-18 DIAGNOSIS — J4530 Mild persistent asthma, uncomplicated: Secondary | ICD-10-CM

## 2020-12-18 DIAGNOSIS — J3089 Other allergic rhinitis: Secondary | ICD-10-CM | POA: Diagnosis not present

## 2020-12-18 MED ORDER — DOXYCYCLINE HYCLATE 50 MG PO CAPS: 50.0000 mg | ORAL_CAPSULE | Freq: Every day | ORAL | 0 refills | Status: DC

## 2020-12-18 NOTE — Patient Instructions (Signed)
  1. Continue to Treat and prevent inflammation of airway:   A. Flonase - 1-2 sprays each nostril one time per day  B. Flovent 110 -2 inhalations one time per day with spacer  2. Treat and prevent classic reflux / reflux induced respiratory disease:   A. Eliminate all forms of caffeine and chocolate consumption slowly  3. Treat rosacea / perioral dermatitis:   A. Continue metrocream 2 times per day  B. Start doxycycline 50 mg - 1 tablet 1 time per day  4. Treat chronic diarrhea:   A. Colestipol 1 gm - 1/2 - 1 tablet 1 time per day  5. If needed:   A. OTC antihistamine -Claritin/Zyrtec   B. Albuterol HFA -2 inhalations one time per day  6. Return to clinic in 12 weeks or earlier if problem

## 2020-12-18 NOTE — Progress Notes (Signed)
[place] - [place] - [place] - [place] - [place]   Follow-up Note   Referring Provider: Natalia Leatherwood, DO Primary Provider: Natalia Leatherwood, DO Date of Office Visit: 12/18/2020  Subjective:   Destiny Wade (DOB: [birth year]) is a 35 y.o. female who [facility] on 12/18/2020 in re-evaluation of the following:  HPI: Destiny Wade returns to this clinic in evaluation of exercise-induced breathing problems possibly secondary to asthma, rhinitis, possible reflux, history of diarrhea, and perioral dermatitis.  I last saw her in his clinic during initial evaluation 20 November 2020 at which point time we attempted to address each issue.  She believes that her breathing is better.  Her exercise capacity is definitely better.  She has no problems with air hunger and she has no problems with her nose.  She continues on Flovent and Flonase.  She has not been having any chest pain.  She is not been having any nausea.  She is no longer vomiting upon awakening in the morning twice a week.  She has decreased her caffeine consumption by 50%.  She never used a proton pump inhibitor.  She has been using MetroCream on her face and she is not sure that this is really helped her dermatitis on her face.  She still continues to have chronic diarrhea about 3 times per week.  Allergies as of 12/18/2020      Reactions   Ceclor [cefaclor]    Effexor [venlafaxine]    Chest tightness and shortness of breath   Penicillins    Unsure of reaction because was a baby   Sulfa Antibiotics       Medication List      albuterol 108 (90 Base) MCG/ACT inhaler Commonly known as: VENTOLIN HFA Inhale 2 puffs into the lungs every 6 (six) hours as needed for wheezing or shortness of breath.   B-12 2000 MCG Tabs Place 2,000 mcg under the tongue every other day. B12 sublingual solution   etonogestrel-ethinyl estradiol 0.12-0.015 MG/24HR vaginal ring Commonly known as:  NuvaRing Insert vaginally and leave in place for 3 consecutive weeks,and insert new Nuvaring for continuous use   Flovent HFA 110 MCG/ACT inhaler Generic drug: fluticasone Inhale 2 puffs into the lungs daily.   fluticasone 50 MCG/ACT nasal spray Commonly known as: FLONASE Place 1-2 sprays into both nostrils daily.   metroNIDAZOLE 0.75 % cream Commonly known as: MetroCream Apply topically 2 (two) times daily. Around the mouth   omeprazole 40 MG capsule Commonly known as: PRILOSEC Take 1 capsule (40 mg total) by mouth daily.       Past Medical History:  Diagnosis Date  . Anxiety   . Asthma   . Depression    on antidepressants in the past, never SI or hospitalization  . IBS (irritable bowel syndrome)   . UTI (lower urinary tract infection)     Past Surgical History:  Procedure Laterality Date  . eye surgery  2006   . TYMPANOSTOMY TUBE PLACEMENT  Age 71   . WISDOM TOOTH EXTRACTION  2004     Review of systems negative except as noted in HPI / PMHx or noted below:  Review of Systems  Constitutional: Negative.   HENT: Negative.   Eyes: Negative.   Respiratory: Negative.   Cardiovascular: Negative.   Gastrointestinal: Negative.   Genitourinary: Negative.   Musculoskeletal: Negative.   Skin: Negative.   Neurological: Negative.   Endo/Heme/Allergies: Negative.   Psychiatric/Behavioral: Negative.  Objective:   Vitals:   12/18/20 1627  BP: 118/80  Pulse: 80  Resp: 18  Temp: 98.2 F (36.8 C)  SpO2: 99%   Height: 5\' 8"  (172.7 cm)  Weight: 128 lb 12.8 oz (58.4 kg)   Physical Exam Skin:    Findings: Rash (Perioral cystic dermatitis with some involvement of cheeks) present.     Diagnostics:    Spirometry was performed and demonstrated an FEV1 of 2.99 at 85 % of predicted.  Assessment and Plan:   1. Asthma, well controlled, mild persistent   2. Other allergic rhinitis   3. Perioral dermatitis   4. Diarrhea, unspecified type     1. Continue to  Treat and prevent inflammation of airway:   A. Flonase - 1-2 sprays each nostril one time per day  B. Flovent 110 -2 inhalations one time per day with spacer  2. Treat and prevent classic reflux / reflux induced respiratory disease:   A. Eliminate all forms of caffeine and chocolate consumption slowly  3. Treat rosacea / perioral dermatitis:   A. Continue metrocream 2 times per day  B. Start doxycycline 50 mg - 1 tablet 1 time per day  4. Treat chronic diarrhea:   A. Colestipol 1 gm - 1/2 - 1 tablet 1 time per day  5. If needed:   A. OTC antihistamine -Claritin/Zyrtec   B. Albuterol HFA -2 inhalations one time per day  6. Return to clinic in 12 weeks or earlier if problem  is better regarding her respiratory tract and I like to continue to have her use a combination of Flovent and Flonase.  I think her chest pain and her morning vomiting is also better because she has decreased her caffeine and chocolate and I have encouraged her to continue to taper down these food items.  Regarding her facial dermatitis, I started her on doxycycline along with continuing on MetroCream.  She has the option of using some colestipol to see if this helps with her chronic diarrhea.  I will regroup with her in 12 weeks to make a determination about further evaluation and treatment based upon her response to this approach.  Destiny Rusk, MD Allergy / Immunology [facility]

## 2020-12-19 ENCOUNTER — Encounter: Payer: Self-pay | Admitting: Allergy and Immunology

## 2020-12-24 ENCOUNTER — Other Ambulatory Visit: Payer: Self-pay

## 2020-12-25 ENCOUNTER — Encounter: Payer: Self-pay | Admitting: Gastroenterology

## 2020-12-25 ENCOUNTER — Other Ambulatory Visit: Payer: BC Managed Care – PPO

## 2020-12-25 ENCOUNTER — Ambulatory Visit (INDEPENDENT_AMBULATORY_CARE_PROVIDER_SITE_OTHER): Payer: BC Managed Care – PPO | Admitting: Gastroenterology

## 2020-12-25 VITALS — BP 130/80 | HR 96 | Ht 68.0 in | Wt 126.0 lb

## 2020-12-25 DIAGNOSIS — R197 Diarrhea, unspecified: Secondary | ICD-10-CM

## 2020-12-25 DIAGNOSIS — R1032 Left lower quadrant pain: Secondary | ICD-10-CM

## 2020-12-25 MED ORDER — RIFAXIMIN 550 MG PO TABS: 550.0000 mg | ORAL_TABLET | Freq: Three times a day (TID) | ORAL | 0 refills | Status: AC

## 2020-12-25 NOTE — Progress Notes (Signed)
Referring Provider: Ma Hillock, DO Primary Care Physician:  Ma Hillock, DO  Reason for Consultation:  Abdominal pain, frequent diarrhea, excess air in the abdomen, frequency vomiting   IMPRESSION:  RUQ Abdominal pain Altered bowel habits diarrhea Significant stool burden on CT 2015 History of IBS, more diarrhea predominent    - previously noted some improvement with Bentyl Anxiety - worseneing GI symptoms   Suspected irritable bowel syndrome. There is are no alarm features including weight loss, blood in the stool not caused by hemorrhoids or anal fissure, fever, nocturnal symptoms, or family history of colon polyps, colon cancer, or celiac disease.   She reports negative testing for celiac, food intolerance, H pylori, and thyroid dysfunction.  At this time will check for IBS masqueraders including celiac disease, IBD, food intolerance (lactose, fructose, sucrose), SIBO, thyroid disorder.    PLAN: - Obtain records from Dr. Collene Mares to evaluate for IBS masqueraders - Breath test for SIBO - Trial of Xifaxan 533m TID x 14 days if appropriate after the breath test - Stool for giardia, calprotectin - Trial to avoid sugar substitutes - Low threshold to add tricyclic antidepressant - Low threshold for EGD and colonoscopy with biopsies  Please see the "Patient Instructions" section for addition details about the plan.  HPI: Destiny Halfhillis a 35y.o. female referred by Dr. KRaoul Pitchand Dr. DDoren Custardfor further evaluation of multiple GI complaints. The history is obtained through the patient and review of her electronic health record. 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 Previously diagnosed with IBS and reflux treated with PPI and H2blocker. She is engaged.   Notes a [place]  Seen now with RUQ abdominal pain, frequent post-prandial  diarrhea with alternating constipation, concerns from her GYN that she is trapping air in her abdomen, and vomiting, with symptoms becoming more frequent over the last year.  Stools are soft. She reports a sense of incomplete evacuation and difficulty with perianal hygeine.   Previously evaluated by Dr. MCollene Maresin 2015. CT abd/pelvis showed a significant amount of stools. TTGA <2, no IgA results available. No endoscopy performed at that time.   Tried gluten free for one year without a change. No improvement minimize the dairy in her diet or trying a low FODMAP diet.   Has not used any prescription, OTC, or herbal therapies to manage her symptoms.  Some Advil for occasional aches and pains. No marijuana. However, records from Dr. KRaoul Pitchmention some improvement with Bentyl.   Worried about food allergies but testing with an allergist normal. She feels like everyone has tried to tell her she had psychosomatic symptoms. Developed anxiety and depression in college but doesn't feel like it's related. Recently started Cymbalta but has not yet noticed any change in her symptoms.   No known family history of colon cancer or polyps. No family history of uterine/endometrial cancer, pancreatic cancer or gastric/stomach cancer.  Labs 05/17/20: ferritin 10.8, iron 101, hgb 13.6, MCV 92.7, RDW 12.3, platelets 267, ALT 22, alk phos 33, TSH 2.17, CRP and ESR normal, ANA positive   Past Medical History:  Diagnosis Date  . Anxiety   . Asthma   . Depression    on antidepressants in the past, never SI or hospitalization  . IBS (irritable bowel syndrome)   . UTI (lower urinary tract infection)     Past Surgical History:  Procedure Laterality  Date  . eye surgery  2006   . TYMPANOSTOMY TUBE PLACEMENT  Age 67   . WISDOM TOOTH EXTRACTION  2004     Current Outpatient Medications  Medication Sig Dispense Refill  . albuterol (VENTOLIN HFA) 108 (90 Base) MCG/ACT inhaler Inhale 2 puffs into the lungs every 6 (six)  hours as needed for wheezing or shortness of breath. 18 g 2  . Cyanocobalamin (B-12) 2000 MCG TABS Place 2,000 mcg under the tongue every other day. B12 sublingual solution    . DULoxetine (CYMBALTA) 30 MG capsule Take 30 mg by mouth daily.    Marland Kitchen etonogestrel-ethinyl estradiol (NUVARING) 0.12-0.015 MG/24HR vaginal ring Insert vaginally and leave in place for 3 consecutive weeks,and insert new Nuvaring for continuous use 3 each 4  . fluticasone (FLONASE) 50 MCG/ACT nasal spray Place 1-2 sprays into both nostrils daily. 16 g 5  . fluticasone (FLOVENT HFA) 110 MCG/ACT inhaler Inhale 2 puffs into the lungs daily. 1 each 5  . metroNIDAZOLE (METROCREAM) 0.75 % cream Apply topically 2 (two) times daily. Around the mouth 45 g 3   No current facility-administered medications for this visit.    Allergies as of 12/25/2020 - Review Complete 12/25/2020  Allergen Reaction Noted  . Ceclor [cefaclor]  06/23/2013  . Effexor [venlafaxine]  06/22/2020  . Penicillins  06/23/2013  . Sulfa antibiotics  06/23/2013    Family History  Adopted: Yes  Problem Relation Age of Onset  . Kidney disease Maternal Aunt   . Lupus Maternal Grandmother   . Fibromyalgia Maternal Grandmother      Review of Systems: 12 system ROS is negative except as noted above with the addition of anxiety, depression, fatigue, and some urinary symptoms.   Physical Exam: General:   Alert,  well-nourished, pleasant and cooperative in NAD Head:  Normocephalic and atraumatic. Eyes:  Sclera clear, no icterus.   Conjunctiva pink. Ears:  Normal auditory acuity. Nose:  No deformity, discharge,  or lesions. Mouth:  No deformity or lesions.   Neck:  Supple; no masses or thyromegaly. Lungs:  Clear throughout to auscultation.   No wheezes. Heart:  Regular rate and rhythm; no murmurs. Abdomen:  Soft,nontender, nondistended, normal bowel sounds, no rebound or guarding. No hepatosplenomegaly.   Rectal:  Deferred  Msk:  Symmetrical. No boney  deformities LAD: No inguinal or umbilical LAD Extremities:  No clubbing or edema. Neurologic:  Alert and  oriented x4;  grossly nonfocal Skin:  Intact without significant lesions or rashes. Psych:  Alert and cooperative. Normal mood and affect.    Kaliya Shreiner L. Tarri Glenn, MD, MPH 12/25/2020, 3:38 PM

## 2020-12-25 NOTE — Progress Notes (Signed)
Records Request  Provider and/or Facility: Dr. Mann  Document: Signed ROI  ROI has been faxed successfully to Dr. Mann. Document(s) and fax confirmation(s) have been placed in the faxed file for future reference.  

## 2020-12-25 NOTE — Patient Instructions (Addendum)
It was a pleasure to meet you today. Based on our discussion, I am providing you with my recommendations below:  RECOMMENDATION(S):   I am recommending that you try to avid sugar substitutes to determine if this may help resolve your symptoms. In addition I am recommending that we order some labs and that you complete a SIBO GLUCOSE test so that we may better evaluate your symptoms. After you have completed this test, I would like for you to TRIAL Xifaxan for 14 days. In the meantime, we will obtain records from Dr. Collene Mares.   SMALL INTESTINE BACTERIAL OVERGROWTH (SIBO):    I am providing you with a handout that further explains the rationale for this test, preparation prior to completion of the test and how to perform the test.  My staff will send your order, demographics and insurance information to [facility]. This company will contact you to verify your information and to send you the SIBO kit. Please follow [facility] instructions regarding collection and submission of your sample. If you have any further questions or concerns, please call [facility] at [phone].  SIBO RESULTS:  . Once you have submitted your specimen to [facility] for processing, it will take up to 7-10 business days for me to receive your results. Once I have reviewed your results, you will receive a call from my office to provide you with my recommendation(s).  PRESCRIPTION MEDICATION(S):   We have sent the following medication(s) to your pharmacy:  . Doreene Nest - Please take 516m 3 times daily for 14 days AFTER you have completed your SIBO test.  NOTE: If your medication(s) requires a PRIOR AUTHORIZATION, we will receive notification from your pharmacy. Once received, the process to submit for approval may take up to 7-10 business days. You will be contacted about any denials we have received from your insurance company as well as alternatives recommended by your provider.  LABS:   . Please  proceed to the basement level for lab work before leaving today. Press "B" on the elevator. The lab is located at the first door on the left as you exit the elevator.  HEALTHCARE LAWS AND MY CHART RESULTS:   . Due to recent changes in healthcare laws, you may see results of your imaging and/or laboratory studies on MyChart before I have had a chance to review them.  I understand that in some cases there may be results that are confusing or concerning to you. Please understand that not all results are received at same time and often I may need to interpret multiple results in order to provide you with the best plan of care or course of treatment. Therefore, I ask that you please give me 48 hours to thoroughly review all your results before contacting my office for clarification.   RECORDS:   We will obtain your previous Gastrointestinal records from Dr. MCollene Mares BMI:  . If you are age 6146or younger, your body mass index should be between 19-25. Your Body mass index is 19.16 kg/m. If this is out of the aformentioned range listed, please consider follow up with your Primary Care Provider.   Thank you for trusting me with your gastrointestinal care!    KThornton Park MD, MPH

## 2020-12-25 NOTE — Progress Notes (Signed)
SIBO ORDER:  Company: [facility]  Documents faxed: Patient demographics, copy of insurance card, SIBO (Glucose) order  All information faxed successfully to [facility]. Document(s) and fax confirmation(s) will be held for future reference.  

## 2021-02-05 ENCOUNTER — Telehealth (HOSPITAL_COMMUNITY): Payer: Self-pay | Admitting: Internal Medicine

## 2021-02-05 NOTE — Telephone Encounter (Signed)
Patient called and cancelled GXT for reason below:  02/05/2021 11:05 AM AP:OLIDCV, SYLVIA M  Cancel Rsn: Patient (will reschedule later)  Order will be removed form the GXT WQ and when patient calls back to reschedule we will reinstate the order. Thank you.

## 2021-02-08 ENCOUNTER — Other Ambulatory Visit (HOSPITAL_COMMUNITY): Payer: BC Managed Care – PPO

## 2021-03-12 ENCOUNTER — Ambulatory Visit: Payer: BC Managed Care – PPO | Admitting: Allergy and Immunology

## 2021-03-21 ENCOUNTER — Other Ambulatory Visit: Payer: Self-pay | Admitting: Gastroenterology

## 2021-03-21 DIAGNOSIS — R1032 Left lower quadrant pain: Secondary | ICD-10-CM

## 2021-03-21 DIAGNOSIS — R197 Diarrhea, unspecified: Secondary | ICD-10-CM

## 2021-05-20 ENCOUNTER — Telehealth: Payer: Self-pay | Admitting: Gastroenterology

## 2021-05-20 NOTE — Telephone Encounter (Signed)
Pt has moved to [place] [facility] [place] and she is wanting to know if Dr. Orvan Falconer could recommend a GI doctor close to her new home. States she is still having the same GI issues she was when she was seen. Please advise.

## 2021-05-20 NOTE — Telephone Encounter (Signed)
Unfortunately, [place]'s [place] a part of the state that I know very well. However, there should be good gastroenterologists in [place], [place], and [place]. I cannot tell which city is closest.

## 2021-05-20 NOTE — Telephone Encounter (Signed)
Pt aware. States she has not been able to do the SIBO test yet because she has not found a 2 week period where she has not had diarrhea. Suggested Pt call the company and ask them if they think it would be ok for her to proceed with the test. States once she does submit it she will let us know.l

## 2021-05-20 NOTE — Telephone Encounter (Signed)
Pt moved to [place], [place]. She states that she has not been able to find a GI practice close to her new address and was wondering if by any chance Dr. Orvan Falconer knew a GI close to her house. Pt states that she is still dealing with same GI issues and is not sure if we can still be able to help her since since she now lives 3 hours away. Pls call her.

## 2022-04-21 ENCOUNTER — Ambulatory Visit: Payer: BC Managed Care – PPO | Admitting: Family Medicine

## 2022-04-21 ENCOUNTER — Encounter: Payer: Self-pay | Admitting: Family Medicine

## 2022-04-21 VITALS — BP 99/63 | HR 84 | Temp 98.8°F | Ht 68.0 in | Wt 128.0 lb

## 2022-04-21 DIAGNOSIS — R531 Weakness: Secondary | ICD-10-CM

## 2022-04-21 DIAGNOSIS — G243 Spasmodic torticollis: Secondary | ICD-10-CM

## 2022-04-21 DIAGNOSIS — M791 Myalgia, unspecified site: Secondary | ICD-10-CM

## 2022-04-21 DIAGNOSIS — R6884 Jaw pain: Secondary | ICD-10-CM | POA: Diagnosis not present

## 2022-04-21 DIAGNOSIS — M2550 Pain in unspecified joint: Secondary | ICD-10-CM | POA: Diagnosis not present

## 2022-04-21 NOTE — Progress Notes (Signed)
Destiny Wade , [birth year], 36 y.o., female MRN: [id number] Patient Care Team    Relationship Specialty Notifications Start End  Ma Hillock, DO PCP - General Family Medicine  05/22/20   Pa, [facility]    05/22/20   Philemon Kingdom, MD Consulting Physician Internal Medicine  05/22/20   Megan Salon, MD Consulting Physician Gynecology  05/22/20   Juanita Craver, MD Consulting Physician Gastroenterology  05/22/20     Chief Complaint  Patient presents with   Joint Pain    F/u; unchanged     Subjective: Pt presents for an OV with complaints of joint pains of bilateral lower extremities and her shoulder.  She states she has aches throughout her body including both muscles and joints.  She reports her shins ache and its not like shinsplints.  She feels her mandible is tight and she has discomfort in her mandible. She has a past medical history of intestinal malabsorption, abdominal pain, chest pain, dyspnea, polyarthralgia, dry eyes, obsessive-compulsive personality trait, vitamin D deficiency, B12 deficiency.  Patient was adopted but has come to the knowledge that she had lupus in her biological family.   Latest Reference Range & Units 05/17/20 11:18  Anti Nuclear Antibody (ANA) NEGATIVE  POSITIVE !  ANA Pattern 1  Nuclear, Speckled !  ANA Titer 1 titer 6:96 (H)  Cyclic Citrullin Peptide Ab UNITS <16  ds DNA Ab IU/mL 3  RA Latex Turbid. <14 IU/mL <14  ENA SM Ab Ser-aCnc <1.0 NEG AI <1.0 NEG  SSA (Ro) (ENA) Antibody, IgG <1.0 NEG AI <1.0 NEG  SSB (La) (ENA) Antibody, IgG <1.0 NEG AI <1.0 NEG  Scleroderma (Scl-70) (ENA) Antibody, IgG <1.0 NEG AI <1.0 NEG  SM/RNP <1.0 NEG AI <1.0 NEG    Latest Reference Range & Units 05/17/20 11:18  Sed Rate 0 - 20 mm/hr 3    Latest Reference Range & Units 05/17/20 11:18  CRP 0.5 - 20.0 mg/dL <1.0    Latest Reference Range & Units 11/20/20 16:47  Deamidated Gliadin Abs, IgG 0 - 19 units 1  Transglutaminase IgA 0 - 3 U/mL <2   Antigliadin Abs, IgA 0 - 19 units 3      04/21/2022    1:04 PM 07/27/2020    3:56 PM 06/11/2020    3:24 PM  Depression screen PHQ 2/9  Decreased Interest 0 2 3  Down, Depressed, Hopeless 0 3 3  PHQ - 2 Score 0 5 6  Altered sleeping  2 1  Tired, decreased energy  3 1  Change in appetite  1 1  Feeling bad or failure about yourself   2 1  Trouble concentrating  2 3  Moving slowly or fidgety/restless  0 0  Suicidal thoughts  0 0  PHQ-9 Score  15 13    Allergies  Allergen Reactions   Ceclor [Cefaclor]    Effexor [Venlafaxine]     Chest tightness and shortness of breath   Penicillins     Unsure of reaction because was a baby   Sulfa Antibiotics    Social History   Social History Narrative   Work or School: works at [employer] Situation: lives with roommate      Spiritual Beliefs: Christian      Lifestyle: regular CV exercise 3-4 times per week; good diet            Past Medical History:  Diagnosis Date   Anxiety  Asthma    Depression    on antidepressants in the past, never SI or hospitalization   IBS (irritable bowel syndrome)    UTI (lower urinary tract infection)    Past Surgical History:  Procedure Laterality Date   eye surgery  2006    TYMPANOSTOMY TUBE PLACEMENT  Age 28    WISDOM TOOTH EXTRACTION  2004    Family History  Adopted: Yes  Problem Relation Age of Onset   Kidney disease Maternal Aunt    Lupus Maternal Grandmother    Fibromyalgia Maternal Grandmother    Allergies as of 04/21/2022       Reactions   Ceclor [cefaclor]    Effexor [venlafaxine]    Chest tightness and shortness of breath   Penicillins    Unsure of reaction because was a baby   Sulfa Antibiotics         Medication List        Accurate as of April 21, 2022 11:59 PM. If you have any questions, ask your nurse or doctor.          STOP taking these medications    albuterol 108 (90 Base) MCG/ACT inhaler Commonly known as: VENTOLIN HFA Stopped by:  Howard Pouch, DO   B-12 2000 MCG Tabs Stopped by: Howard Pouch, DO   DULoxetine 30 MG capsule Commonly known as: CYMBALTA Stopped by: Howard Pouch, DO   Flovent HFA 110 MCG/ACT inhaler Generic drug: fluticasone Stopped by: Howard Pouch, DO   metroNIDAZOLE 0.75 % cream Commonly known as: MetroCream Stopped by: Howard Pouch, DO       TAKE these medications    etonogestrel-ethinyl estradiol 0.12-0.015 MG/24HR vaginal ring Commonly known as: NuvaRing Insert vaginally and leave in place for 3 consecutive weeks,and insert new Nuvaring for continuous use   fluticasone 50 MCG/ACT nasal spray Commonly known as: FLONASE Place 1-2 sprays into both nostrils daily.        All past medical history, surgical history, allergies, family history, immunizations andmedications were updated in the EMR today and reviewed under the history and medication portions of their EMR.     Review of Systems  Constitutional:  Positive for malaise/fatigue. Negative for chills, diaphoresis, fever and weight loss.  HENT: Negative.    Eyes:  Positive for discharge. Negative for pain and redness.  Respiratory: Negative.    Cardiovascular: Negative.   Gastrointestinal: Negative.   Genitourinary: Negative.   Musculoskeletal:  Positive for back pain, joint pain, myalgias and neck pain. Negative for falls.  Skin:  Negative for rash.  Neurological:  Positive for dizziness, tingling, sensory change, speech change, focal weakness, weakness and headaches. Negative for tremors, seizures and loss of consciousness.  Endo/Heme/Allergies:  Negative for environmental allergies and polydipsia. Does not bruise/bleed easily.  Psychiatric/Behavioral: Negative.     Negative, with the exception of above mentioned in HPI   Objective:  BP 99/63   Pulse 84   Temp 98.8 F (37.1 C) (Oral)   Ht 5' 8" (1.727 m)   Wt 128 lb (58.1 kg)   SpO2 100%   BMI 19.46 kg/m  Body mass index is 19.46 kg/m. Physical Exam Vitals  and nursing note reviewed.  Constitutional:      General: She is not in acute distress.    Appearance: She is not ill-appearing, toxic-appearing or diaphoretic.     Comments: Thin female female.  Very pleasant.   HENT:     Nose: Nose normal.     Mouth/Throat:  Mouth: Mucous membranes are moist.  Eyes:     Extraocular Movements: Extraocular movements intact.     Conjunctiva/sclera: Conjunctivae normal.     Pupils: Pupils are equal, round, and reactive to light.  Neck:     Comments: Right deviation of mandible Cardiovascular:     Rate and Rhythm: Normal rate and regular rhythm.     Heart sounds: No murmur heard. Pulmonary:     Effort: Pulmonary effort is normal. No respiratory distress.     Breath sounds: Normal breath sounds. No wheezing, rhonchi or rales.  Musculoskeletal:        General: Tenderness present. No swelling or signs of injury.     Cervical back: Neck supple.     Comments: Cervical range of motion decreased.  Pain with all planes of range of motion.  Skin:    Findings: No rash.  Neurological:     General: No focal deficit present.     Mental Status: She is alert and oriented to person, place, and time. Mental status is at baseline.  Psychiatric:        Attention and Perception: Attention and perception normal.        Mood and Affect: Mood and affect normal.        Speech: She is communicative. Speech is not rapid and pressured, delayed, slurred or tangential.        Behavior: Behavior is cooperative.        Thought Content: Thought content is not delusional. Thought content does not include homicidal or suicidal ideation.        Cognition and Memory: Cognition and memory normal.        Judgment: Judgment is not impulsive or inappropriate.      No results found. No results found. No results found for this or any previous visit (from the past 24 hour(s)).  Assessment/Plan: Destiny Wade is a 36 y.o. female present for OV for   Polyarthralgia/myalgia/weakness/jaw pain Patient presents today with similar complaints as 2 years ago.  They have continued over this time and worsened.  She still has concerns over having lupus or other autoimmune or neurological condition.  Discussed collecting labs today.  Although unlikely ANA will change, there is a possibility it was an accurate prior. 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 - ANA, IFA Comprehensive Panel-([facility]) - PTH, Intact and Calcium - HLA-B27 antigen - C3 and C4 - B12 - Comp Met (CMET) - Magnesium - Sedimentation rate - TSH - Vitamin D (25 hydroxy) -Lyme titer-Future - Ambulatory referral to Neurology.  Consider diagnosis of CRPS versus other neurological conditions.  Reviewed expectations re: course of current medical issues. Discussed self-management of symptoms. Outlined signs and symptoms indicating need for more acute intervention. Patient verbalized understanding and all questions were answered. Patient received an After-Visit Summary.    Orders Placed This Encounter  Procedures   ANA, IFA Comprehensive Panel-([facility])   PTH, Intact and Calcium   HLA-B27 antigen   C3 and C4   B12   Comp Met (CMET)   Magnesium   Sedimentation rate   TSH   Vitamin D (25 hydroxy)   Anti-nuclear ab-titer (ANA titer)   Ambulatory referral to Neurology   No orders of the defined types were placed in this encounter.  Referral Orders         Ambulatory referral to Neurology       Note is dictated utilizing voice  recognition software. Although note has been proof read prior to signing, occasional typographical errors still can be missed. If any questions arise, please do not hesitate to call for verification.   electronically signed by:  Howard Pouch, DO  [place]

## 2022-04-24 ENCOUNTER — Telehealth: Payer: Self-pay | Admitting: Family Medicine

## 2022-04-24 DIAGNOSIS — G243 Spasmodic torticollis: Secondary | ICD-10-CM | POA: Insufficient documentation

## 2022-04-24 DIAGNOSIS — R531 Weakness: Secondary | ICD-10-CM

## 2022-04-24 DIAGNOSIS — M2550 Pain in unspecified joint: Secondary | ICD-10-CM

## 2022-04-24 NOTE — Telephone Encounter (Signed)
LVM for pt to CB regarding results.  

## 2022-04-24 NOTE — Telephone Encounter (Signed)
Patient returning call.  I told her someone would follow up with her before end of day

## 2022-04-24 NOTE — Telephone Encounter (Signed)
Please call patient: Her sed rate lab is still pending.  We will inform her results via MyChart.   Liver, kidney and electrolytes are all normal with the exception of a mildly low potassium.  I would encourage her to start a multivitamin if she does not take 1 already and increase the potassium content in her diet - bananas, oranges, cantaloupe, grapefruit ,prunes, raisins, and dates,Cooked spinach., Cooked broccoli.,Potatoes.,Sweet potatoes,Mushrooms, Peas,Cucumbers Thyroid function is normal. Her B12 is normal and magnesium levels are normal. Her vitamin D is low at 21.  I would encourage her to restart vitamin D 800 units daily. ANA is positive with the titer the same as prior at 1: 40. The ANA reflex panel is normal. The HLA-B27 is normal Her complement levels are normal-this makes lupus very unlikely as well.   I have placed a referral to neurology for her.  Next step would be to rule out a neurological condition causing her symptoms. I also placed a future order to have Lyme titers tested.  She can make a lab appointment only within the next 2 weeks to have these completed.

## 2022-04-25 NOTE — Telephone Encounter (Signed)
Pt has been informed of her results and provider message. Pt stated she will call back to schedule her lab only appointment as she has limited time today. Pt had no other questions or concerns.

## 2022-04-25 NOTE — Telephone Encounter (Signed)
LVM for pt to CB regarding results.  

## 2022-04-28 LAB — VITAMIN B12: Vitamin B-12: 536 pg/mL (ref 200–1100)

## 2022-04-28 LAB — ANA, IFA COMPREHENSIVE PANEL
Anti Nuclear Antibody (ANA): POSITIVE — AB
ENA SM Ab Ser-aCnc: <1 AI
SM/RNP: <1 AI
SSA (Ro) (ENA) Antibody, IgG: <1 AI
SSB (La) (ENA) Antibody, IgG: <1 AI
Scleroderma (Scl-70) (ENA) Antibody, IgG: <1 AI
ds DNA Ab: 3 IU/mL

## 2022-04-28 LAB — COMPREHENSIVE METABOLIC PANEL
AG Ratio: 1.5 (calc) (ref 1.0–2.5)
ALT: 11 U/L (ref 6–29)
AST: 16 U/L (ref 10–30)
Albumin: 4.3 g/dL (ref 3.6–5.1)
Alkaline phosphatase (APISO): 35 U/L (ref 31–125)
BUN: 10 mg/dL (ref 7–25)
CO2: 24 mmol/L (ref 20–32)
Calcium: 9.1 mg/dL (ref 8.6–10.2)
Chloride: 107 mmol/L (ref 98–110)
Creat: 0.81 mg/dL (ref 0.50–0.97)
Globulin: 2.8 g/dL (calc) (ref 1.9–3.7)
Glucose, Bld: 140 mg/dL — ABNORMAL HIGH (ref 65–99)
Potassium: 3.3 mmol/L — ABNORMAL LOW (ref 3.5–5.3)
Sodium: 140 mmol/L (ref 135–146)
Total Bilirubin: 0.7 mg/dL (ref 0.2–1.2)
Total Protein: 7.1 g/dL (ref 6.1–8.1)

## 2022-04-28 LAB — VITAMIN D 25 HYDROXY (VIT D DEFICIENCY, FRACTURES): Vit D, 25-Hydroxy: 21 ng/mL — ABNORMAL LOW (ref 30–100)

## 2022-04-28 LAB — MAGNESIUM: Magnesium: 2 mg/dL (ref 1.5–2.5)

## 2022-04-28 LAB — C3 AND C4
C3 Complement: 129 mg/dL (ref 83–193)
C4 Complement: 24 mg/dL (ref 15–57)

## 2022-04-28 LAB — PTH, INTACT AND CALCIUM
Calcium: 9.1 mg/dL (ref 8.6–10.2)
PTH: 36 pg/mL (ref 16–77)

## 2022-04-28 LAB — ANTI-NUCLEAR AB-TITER (ANA TITER): ANA Titer 1: 1:40 {titer} — ABNORMAL HIGH

## 2022-04-28 LAB — TSH: TSH: 1.77 mIU/L

## 2022-04-28 LAB — HLA-B27 ANTIGEN: HLA-B27 Antigen: NEGATIVE

## 2022-04-28 LAB — SEDIMENTATION RATE

## 2022-05-06 ENCOUNTER — Telehealth: Payer: Self-pay | Admitting: Family Medicine

## 2022-05-06 DIAGNOSIS — R6884 Jaw pain: Secondary | ICD-10-CM

## 2022-05-06 DIAGNOSIS — M791 Myalgia, unspecified site: Secondary | ICD-10-CM

## 2022-05-06 DIAGNOSIS — M542 Cervicalgia: Secondary | ICD-10-CM

## 2022-05-06 NOTE — Telephone Encounter (Signed)
Referral to [facility] neurology placed

## 2022-05-07 ENCOUNTER — Encounter: Payer: Self-pay | Admitting: Neurology

## 2022-08-20 ENCOUNTER — Ambulatory Visit: Payer: BC Managed Care – PPO | Admitting: Neurology
